# Patient Record
Sex: Male | Born: 1975 | Race: Black or African American | Hispanic: No | Marital: Single | Smoking: Never smoker
Health system: Southern US, Community
[De-identification: ages and names within clinical notes are randomized; demographics above are authoritative.]

## PROBLEM LIST (undated history)

## (undated) DIAGNOSIS — J45909 Unspecified asthma, uncomplicated: Secondary | ICD-10-CM

## (undated) HISTORY — PX: OTHER SURGICAL HISTORY: SHX169

---

## 2004-11-02 ENCOUNTER — Emergency Department: Payer: Self-pay | Admitting: Emergency Medicine

## 2005-02-06 ENCOUNTER — Emergency Department: Payer: Self-pay | Admitting: Emergency Medicine

## 2005-05-27 ENCOUNTER — Emergency Department: Payer: Self-pay | Admitting: Emergency Medicine

## 2005-12-13 ENCOUNTER — Emergency Department: Payer: Self-pay | Admitting: Internal Medicine

## 2007-01-19 ENCOUNTER — Emergency Department: Payer: Self-pay | Admitting: Emergency Medicine

## 2013-05-15 ENCOUNTER — Emergency Department: Payer: Self-pay | Admitting: Internal Medicine

## 2016-04-01 ENCOUNTER — Encounter: Payer: Self-pay | Admitting: *Deleted

## 2016-04-01 ENCOUNTER — Emergency Department: Payer: BLUE CROSS/BLUE SHIELD

## 2016-04-01 DIAGNOSIS — J40 Bronchitis, not specified as acute or chronic: Secondary | ICD-10-CM | POA: Insufficient documentation

## 2016-04-01 DIAGNOSIS — R079 Chest pain, unspecified: Secondary | ICD-10-CM | POA: Diagnosis present

## 2016-04-01 LAB — BASIC METABOLIC PANEL
ANION GAP: 5 (ref 5–15)
BUN: 21 mg/dL — ABNORMAL HIGH (ref 6–20)
CALCIUM: 9.2 mg/dL (ref 8.9–10.3)
CO2: 29 mmol/L (ref 22–32)
Chloride: 107 mmol/L (ref 101–111)
Creatinine, Ser: 1.11 mg/dL (ref 0.61–1.24)
GFR calc Af Amer: >60 mL/min (ref >60)
GFR calc non Af Amer: >60 mL/min (ref >60)
Glucose, Bld: 96 mg/dL (ref 65–99)
POTASSIUM: 3.8 mmol/L (ref 3.5–5.1)
Sodium: 141 mmol/L (ref 135–145)

## 2016-04-01 LAB — CBC
HEMATOCRIT: 41.3 % (ref 40.0–52.0)
HEMOGLOBIN: 13.6 g/dL (ref 13.0–18.0)
MCH: 28 pg (ref 26.0–34.0)
MCHC: 33 g/dL (ref 32.0–36.0)
MCV: 85 fL (ref 80.0–100.0)
Platelets: 317 10*3/uL (ref 150–440)
RBC: 4.86 MIL/uL (ref 4.40–5.90)
RDW: 14.9 % — ABNORMAL HIGH (ref 11.5–14.5)
WBC: 6.6 10*3/uL (ref 3.8–10.6)

## 2016-04-01 LAB — TROPONIN I

## 2016-04-01 MED ORDER — ACETAMINOPHEN 325 MG PO TABS
650.0000 mg | ORAL_TABLET | Freq: Once | ORAL | Status: AC | PRN
  Administered 2016-04-01: 650 mg via ORAL
  Filled 2016-04-01: qty 2

## 2016-04-01 NOTE — ED Notes (Signed)
Pt currently reporting a headache. Pt offered standing order tylenol and requested to have medication while waiting in lobby.

## 2016-04-01 NOTE — ED Triage Notes (Signed)
PT to ED reporting centralized chest pain when he coughs for the past week. Pt reports having been treated for bronchitis over a month ago for similar chest pains. PT reports the chest pain only occurs when he coughs. Pt reports congested cough with yellow sputum. Pt denies fevers at home. No NVD, dizziness or lightheadedness. No SOB.

## 2016-04-02 ENCOUNTER — Emergency Department
Admission: EM | Admit: 2016-04-02 | Discharge: 2016-04-02 | Disposition: A | Discharge status: Home / Self Care | Payer: BLUE CROSS/BLUE SHIELD | Attending: Emergency Medicine | Admitting: Emergency Medicine

## 2016-04-02 DIAGNOSIS — J40 Bronchitis, not specified as acute or chronic: Secondary | ICD-10-CM

## 2016-04-02 MED ORDER — IPRATROPIUM-ALBUTEROL 0.5-2.5 (3) MG/3ML IN SOLN
3.0000 mL | Freq: Once | RESPIRATORY_TRACT | Status: AC
  Administered 2016-04-02: 3 mL via RESPIRATORY_TRACT
  Filled 2016-04-02: qty 3

## 2016-04-02 MED ORDER — ALBUTEROL SULFATE HFA 108 (90 BASE) MCG/ACT IN AERS: 2.0000 | INHALATION_SPRAY | RESPIRATORY_TRACT | 0 refills | Status: DC | PRN

## 2016-04-02 MED ORDER — PREDNISONE 20 MG PO TABS
60.0000 mg | ORAL_TABLET | Freq: Once | ORAL | Status: AC
  Administered 2016-04-02: 60 mg via ORAL
  Filled 2016-04-02: qty 3

## 2016-04-02 MED ORDER — PREDNISONE 20 MG PO TABS: ORAL_TABLET | ORAL | 0 refills | Status: DC

## 2016-04-02 MED ORDER — HYDROCOD POLST-CPM POLST ER 10-8 MG/5ML PO SUER
5.0000 mL | Freq: Once | ORAL | Status: AC
  Administered 2016-04-02: 5 mL via ORAL
  Filled 2016-04-02: qty 5

## 2016-04-02 MED ORDER — HYDROCOD POLST-CPM POLST ER 10-8 MG/5ML PO SUER: 5.0000 mL | Freq: Two times a day (BID) | ORAL | 0 refills | Status: DC

## 2016-04-02 NOTE — Discharge Instructions (Signed)
1. Finish steroid as prescribed (prednisone 60 mg daily 4 days). 2. You may take cough medicine as needed. 3. Use albuterol inhaler 2 puffs every 4 hours as needed for cough/wheezing. 4. Return to the ER for worsening symptoms, persistent vomiting, difficulty breathing or other concerns.

## 2016-04-02 NOTE — ED Provider Notes (Signed)
[facility] Emergency Department Provider Note   ____________________________________________   First MD Initiated Contact with Patient 04/02/16 0315     (approximate)  I have reviewed the triage vital signs and the nursing notes.   HISTORY  Chief Complaint Chest Pain    HPI Robert Vance is a 41 y.o. male who presents to the ED from home with a chief complaint of cough and chest wall pain. Patient reports symptoms for the past week. History of bronchitis 2 months ago with similar symptoms of central chest discomfort only upon coughing. Reports congestion and cough with yellow sputum. Denies associated fever, chills, shortness of breath, abdominal pain, nausea, vomiting, diarrhea.Denies recent travel or trauma. Nothing makes his symptoms better or worse.   Past medical history None  There are no active problems to display for this patient.   History reviewed. No pertinent surgical history.  Prior to Admission medications   Not on File    Allergies Shellfish allergy  History reviewed. No pertinent family history.  Social History Social History  Substance Use Topics  . Smoking status: Never Smoker  . Smokeless tobacco: Never Used  . Alcohol use No    Review of Systems  Constitutional: No fever/chills. Eyes: No visual changes. ENT: No sore throat. Cardiovascular: Positive for chest wall pain. Respiratory: Positive for congested and productive cough. Denies shortness of breath. Gastrointestinal: No abdominal pain.  No nausea, no vomiting.  No diarrhea.  No constipation. Genitourinary: Negative for dysuria. Musculoskeletal: Negative for back pain. Skin: Negative for rash. Neurological: Negative for headaches, focal weakness or numbness.  10-point ROS otherwise negative.  ____________________________________________   PHYSICAL EXAM:  VITAL SIGNS: ED Triage Vitals  Enc Vitals Group     BP 04/02/16 0219 (!) 147/95     Pulse  Rate 04/02/16 0219 91     Resp 04/02/16 0219 20     Temp 04/02/16 0219 98.6 F (37 C)     Temp Source 04/02/16 0219 Oral     SpO2 04/02/16 0219 95 %     Weight 04/01/16 2252 176 lb (79.8 kg)     Height 04/01/16 2252 5\' 7"  (1.702 m)     Head Circumference --      Peak Flow --      Pain Score 04/01/16 2253 5     Pain Loc --      Pain Edu? --      Excl. in GC? --     Constitutional: Alert and oriented. Well appearing and in no acute distress. Eyes: Conjunctivae are normal. PERRL. EOMI. Head: Atraumatic. Nose: No congestion/rhinnorhea. Mouth/Throat: Mucous membranes are moist.  Oropharynx non-erythematous. Neck: No stridor.   Cardiovascular: Normal rate, regular rhythm. Grossly normal heart sounds.  Good peripheral circulation. Respiratory: Normal respiratory effort.  No retractions. Lungs with scattered wheezing. Anterior chest wall tender to palpation.  Gastrointestinal: Soft and nontender. No distention. No abdominal bruits. No CVA tenderness. Musculoskeletal: No lower extremity tenderness nor edema.  No joint effusions. Neurologic:  Normal speech and language. No gross focal neurologic deficits are appreciated. No gait instability. Skin:  Skin is warm, dry and intact. No rash noted. Psychiatric: Mood and affect are normal. Speech and behavior are normal.  ____________________________________________   LABS (all labs ordered are listed, but only abnormal results are displayed)  Labs Reviewed  BASIC METABOLIC PANEL - Abnormal; Notable for the following:       Result Value   BUN 21 (*)    All other components  within normal limits  CBC - Abnormal; Notable for the following:    RDW 14.9 (*)    All other components within normal limits  TROPONIN I   ____________________________________________  EKG  ED ECG REPORT I, SUNG,JADE J, the attending physician, personally viewed and interpreted this ECG.   Date: 04/02/2016  EKG Time: 2256  Rate: 89  Rhythm: normal EKG, normal  sinus rhythm  Axis: Normal  Intervals:none  ST&T Change: Nonspecific  ____________________________________________  RADIOLOGY  Chest X-ray interpreted per Dr. Clovis Riley: No active cardiopulmonary disease. ____________________________________________   PROCEDURES  Procedure(s) performed: None  Procedures  Critical Care performed: No  ____________________________________________   INITIAL IMPRESSION / ASSESSMENT AND PLAN / ED COURSE  Pertinent labs & imaging results that were available during my care of the patient were reviewed by me and considered in my medical decision making (see chart for details).  41 year old male who presents with bronchitis. Will initiate treatment with DuoNeb, prednisone and Tussionex. Will reassess after nebulizer treatment.  Clinical Course as of Apr 03 554  Sat Apr 02, 2016  1610 Wheezing resolved. Patient feeling better. Room air saturations 96%. Strict return precautions given. Patient verbalizes understanding and agrees with plan of care.  [JS]    Clinical Course User Index [JS] Irean Hong, MD     ____________________________________________   FINAL CLINICAL IMPRESSION(S) / ED DIAGNOSES  Final diagnoses:  Bronchitis      NEW MEDICATIONS STARTED DURING THIS VISIT:  New Prescriptions   No medications on file     Note:  This document was prepared using Dragon voice recognition software and may include unintentional dictation errors.    Irean Hong, MD 04/02/16 [phone]

## 2016-05-28 ENCOUNTER — Emergency Department: Payer: BLUE CROSS/BLUE SHIELD

## 2016-05-28 ENCOUNTER — Emergency Department
Admission: EM | Admit: 2016-05-28 | Discharge: 2016-05-28 | Disposition: A | Discharge status: Home / Self Care | Payer: BLUE CROSS/BLUE SHIELD | Attending: Emergency Medicine | Admitting: Emergency Medicine

## 2016-05-28 DIAGNOSIS — J069 Acute upper respiratory infection, unspecified: Secondary | ICD-10-CM | POA: Insufficient documentation

## 2016-05-28 DIAGNOSIS — R0602 Shortness of breath: Secondary | ICD-10-CM | POA: Diagnosis present

## 2016-05-28 DIAGNOSIS — J9801 Acute bronchospasm: Secondary | ICD-10-CM | POA: Diagnosis not present

## 2016-05-28 LAB — BASIC METABOLIC PANEL
ANION GAP: 8 (ref 5–15)
BUN: 18 mg/dL (ref 6–20)
CALCIUM: 9.4 mg/dL (ref 8.9–10.3)
CO2: 28 mmol/L (ref 22–32)
Chloride: 104 mmol/L (ref 101–111)
Creatinine, Ser: 0.94 mg/dL (ref 0.61–1.24)
GFR calc Af Amer: >60 mL/min (ref >60)
Glucose, Bld: 141 mg/dL — ABNORMAL HIGH (ref 65–99)
POTASSIUM: 4.4 mmol/L (ref 3.5–5.1)
Sodium: 140 mmol/L (ref 135–145)

## 2016-05-28 LAB — CBC
HCT: 43.3 % (ref 40.0–52.0)
HEMOGLOBIN: 14.4 g/dL (ref 13.0–18.0)
MCH: 28.1 pg (ref 26.0–34.0)
MCHC: 33.3 g/dL (ref 32.0–36.0)
MCV: 84.5 fL (ref 80.0–100.0)
PLATELETS: 340 10*3/uL (ref 150–440)
RBC: 5.12 MIL/uL (ref 4.40–5.90)
RDW: 14.2 % (ref 11.5–14.5)
WBC: 9.4 10*3/uL (ref 3.8–10.6)

## 2016-05-28 LAB — TROPONIN I

## 2016-05-28 MED ORDER — ALBUTEROL SULFATE (2.5 MG/3ML) 0.083% IN NEBU
5.0000 mg | INHALATION_SOLUTION | Freq: Once | RESPIRATORY_TRACT | Status: AC
  Administered 2016-05-28: 5 mg via RESPIRATORY_TRACT
  Filled 2016-05-28: qty 6

## 2016-05-28 MED ORDER — METHYLPREDNISOLONE SODIUM SUCC 125 MG IJ SOLR
125.0000 mg | Freq: Once | INTRAMUSCULAR | Status: AC
  Administered 2016-05-28: 125 mg via INTRAVENOUS
  Filled 2016-05-28: qty 2

## 2016-05-28 MED ORDER — ALBUTEROL SULFATE HFA 108 (90 BASE) MCG/ACT IN AERS: 2.0000 | INHALATION_SPRAY | Freq: Four times a day (QID) | RESPIRATORY_TRACT | 2 refills | Status: DC | PRN

## 2016-05-28 MED ORDER — IPRATROPIUM-ALBUTEROL 0.5-2.5 (3) MG/3ML IN SOLN
3.0000 mL | Freq: Once | RESPIRATORY_TRACT | Status: AC
  Administered 2016-05-28: 3 mL via RESPIRATORY_TRACT
  Filled 2016-05-28: qty 3

## 2016-05-28 MED ORDER — ALBUTEROL SULFATE (2.5 MG/3ML) 0.083% IN NEBU
INHALATION_SOLUTION | RESPIRATORY_TRACT | Status: AC
  Administered 2016-05-28: 5 mg via RESPIRATORY_TRACT
  Filled 2016-05-28: qty 6

## 2016-05-28 MED ORDER — PREDNISONE 20 MG PO TABS: 40.0000 mg | ORAL_TABLET | Freq: Every day | ORAL | 0 refills | Status: DC

## 2016-05-28 MED ORDER — AZITHROMYCIN 250 MG PO TABS: ORAL_TABLET | ORAL | 0 refills | Status: AC

## 2016-05-28 NOTE — ED Triage Notes (Signed)
Pt presents via POV from home with reports of shortness of breath and wheezing that have been worse since yesterday. Pt denies any hx of asthma or COPD, does not use inhalers at home. States he was treated about 2 months ago for viral bronchitis. States he thinks its worse now. Dry non=productive cough.

## 2016-05-28 NOTE — ED Notes (Signed)
MD Paduchowski at bedside  

## 2016-05-28 NOTE — ED Provider Notes (Signed)
[facility] Emergency Department Provider Note  Time seen: 8:10 AM  I have reviewed the triage vital signs and the nursing notes.   HISTORY  Chief Complaint Shortness of Breath and Wheezing    HPI Robert Vance is a 41 y.o. male with no past medical history who presents to the emergency department for difficulty breathing. According to the patient for the past 2 or 3 days he has had a dry cough with a mild wheeze. Patient states he was having car trouble and he got a ride to work today with someone who smokes, he states since that ride he has been feeling significantly short of breath with significant wheeze at work. Patient came to the emergency department due to the difficulty breathing. Upon arrival he has a 90% room air saturation. Patient states this has happened twice in the past both times he was diagnosed with bronchitis and discharged with inhalers which seemed to help, he states the last time this occurred was approximately 2 or 3 months ago. He does not have any underlying asthma that he is aware of. Denies any smoking history. No history of COPD. Currently describes his shortness of breath is moderate. Denies any chest pain. He does state an occasional dry cough without sputum production. Denies any vomiting or diarrhea. Denies any congestion.  No past medical history on file.  There are no active problems to display for this patient.   No past surgical history on file.  Prior to Admission medications   Medication Sig Start Date End Date Taking? Authorizing Provider  albuterol (PROVENTIL HFA;VENTOLIN HFA) 108 (90 Base) MCG/ACT inhaler Inhale 2 puffs into the lungs every 4 (four) hours as needed for wheezing or shortness of breath. 04/02/16   Irean Hong, MD  chlorpheniramine-HYDROcodone (TUSSIONEX PENNKINETIC ER) 10-8 MG/5ML SUER Take 5 mLs by mouth 2 (two) times daily. 04/02/16   Irean Hong, MD  predniSONE (DELTASONE) 20 MG tablet 3 tablets daily x 4  days 04/02/16   Irean Hong, MD    Allergies  Allergen Reactions  . Shellfish Allergy     No family history on file.  Social History Social History  Substance Use Topics  . Smoking status: Never Smoker  . Smokeless tobacco: Never Used  . Alcohol use No    Review of Systems Constitutional: Negative for fever. Eyes: Negative for visual changes. ENT: Negative for congestion Cardiovascular: Negative for chest pain. Respiratory: Moderate shortness of breath, dry cough Gastrointestinal: Negative for abdominal pain, vomiting and diarrhea. Genitourinary: Negative for dysuria. Musculoskeletal: Negative for back pain. Skin: Negative for rash. Neurological: Negative for headache All other ROS negative  ____________________________________________   PHYSICAL EXAM:  VITAL SIGNS: ED Triage Vitals  Enc Vitals Group     BP 05/28/16 0751 (!) 156/73     Pulse Rate 05/28/16 0751 (!) 107     Resp 05/28/16 0751 (!) 30     Temp 05/28/16 0810 97.7 F (36.5 C)     Temp Source 05/28/16 0810 Oral     SpO2 05/28/16 0751 90 %     Weight --      Height --      Head Circumference --      Peak Flow --      Pain Score --      Pain Loc --      Pain Edu? --      Excl. in GC? --     Constitutional: Alert and oriented. Well appearing and  in no distress. Eyes: Normal exam ENT   Head: Normocephalic and atraumatic.   Nose: No congestion/rhinnorhea.   Mouth/Throat: Mucous membranes are moist. Cardiovascular: Normal rate, regular rhythm. No murmur Respiratory: Moderate tachypnea with mild to moderate expiratory wheezes bilaterally. No rales or rhonchi. Gastrointestinal: Soft and nontender. No distention. Musculoskeletal: Nontender with normal range of motion in all extremities. No lower extremity tenderness or edema. Neurologic:  Normal speech and language. No gross focal neurologic deficits  Skin:  Skin is warm, dry and intact.  Psychiatric: Mood and affect are normal.    ____________________________________________    EKG  EKG reviewed and interpreted by myself shows sinus tachycardia at 107 bpm, narrow QRS, normal axis, normal intervals, no concerning ST changes.  ____________________________________________    RADIOLOGY  Chest x-ray negative  ____________________________________________   INITIAL IMPRESSION / ASSESSMENT AND PLAN / ED COURSE  Pertinent labs & imaging results that were available during my care of the patient were reviewed by me and considered in my medical decision making (see chart for details).  Patient presents the emergency department with wheeze, shortness of breath and a dry cough. Patient states the symptoms are recurrent, highly suspect an underlying degree of reactive airway disease with either environmental triggers or infectious triggers. Patient has moderate expiratory wheeze currently. We'll treat with DuoNeb's in the emergency department. We will treat with Solu-Medrol, obtain basic labs as well as a chest x-ray and continue to closely monitor.  Patient's labs are largely within normal limits including normal white blood cell count, negative troponin. Patient's chest x-ray is negative. Patient states he is feeling much better currently satting 94% on room air. We will discharge with albuterol, 5 days of prednisone. I discussed strict return precautions with the patient. We will also cover with a Z-Pak as a precaution. Patient agreeable to plan.  ____________________________________________   FINAL CLINICAL IMPRESSION(S) / ED DIAGNOSES  Dyspnea Reactive airway disease    Minna AntisPaduchowski, Salima Rumer, MD 05/28/16 [phone]

## 2016-05-28 NOTE — ED Notes (Signed)
Pt verbalized understanding of discharge instructions. NAD at this time. 

## 2016-05-28 NOTE — ED Notes (Signed)
Patient transported to X-ray 

## 2016-07-24 ENCOUNTER — Emergency Department
Admission: EM | Admit: 2016-07-24 | Discharge: 2016-07-24 | Disposition: A | Discharge status: Home / Self Care | Payer: BLUE CROSS/BLUE SHIELD | Attending: Emergency Medicine | Admitting: Emergency Medicine

## 2016-07-24 ENCOUNTER — Emergency Department: Payer: BLUE CROSS/BLUE SHIELD

## 2016-07-24 DIAGNOSIS — R0602 Shortness of breath: Secondary | ICD-10-CM | POA: Diagnosis not present

## 2016-07-24 DIAGNOSIS — Z5321 Procedure and treatment not carried out due to patient leaving prior to being seen by health care provider: Secondary | ICD-10-CM | POA: Diagnosis not present

## 2016-07-24 MED ORDER — ALBUTEROL SULFATE (2.5 MG/3ML) 0.083% IN NEBU
5.0000 mg | INHALATION_SOLUTION | Freq: Once | RESPIRATORY_TRACT | Status: AC
  Administered 2016-07-24: 5 mg via RESPIRATORY_TRACT
  Filled 2016-07-24: qty 6

## 2016-07-24 NOTE — ED Notes (Signed)
Pt refusing xray until seen by MD

## 2016-07-24 NOTE — ED Triage Notes (Addendum)
Patient reports being short of breath since Tuesday/Wednesday, got "bad" 2 hours prior to arrival.  Patient with audible wheezing heard in triage.

## 2016-07-24 NOTE — ED Notes (Signed)
Called in waiting room x 2.  No answer.

## 2016-07-25 ENCOUNTER — Encounter: Payer: Self-pay | Admitting: Emergency Medicine

## 2016-07-25 ENCOUNTER — Emergency Department
Admission: EM | Admit: 2016-07-25 | Discharge: 2016-07-25 | Disposition: A | Discharge status: Home / Self Care | Payer: BLUE CROSS/BLUE SHIELD | Attending: Emergency Medicine | Admitting: Emergency Medicine

## 2016-07-25 DIAGNOSIS — J209 Acute bronchitis, unspecified: Secondary | ICD-10-CM | POA: Diagnosis not present

## 2016-07-25 DIAGNOSIS — R05 Cough: Secondary | ICD-10-CM | POA: Diagnosis not present

## 2016-07-25 DIAGNOSIS — R0602 Shortness of breath: Secondary | ICD-10-CM | POA: Diagnosis present

## 2016-07-25 MED ORDER — IPRATROPIUM-ALBUTEROL 0.5-2.5 (3) MG/3ML IN SOLN
3.0000 mL | Freq: Once | RESPIRATORY_TRACT | Status: AC
  Administered 2016-07-25: 3 mL via RESPIRATORY_TRACT
  Filled 2016-07-25: qty 3

## 2016-07-25 MED ORDER — ALBUTEROL SULFATE HFA 108 (90 BASE) MCG/ACT IN AERS: 2.0000 | INHALATION_SPRAY | Freq: Four times a day (QID) | RESPIRATORY_TRACT | 0 refills | Status: DC | PRN

## 2016-07-25 MED ORDER — METHYLPREDNISOLONE SODIUM SUCC 125 MG IJ SOLR
125.0000 mg | Freq: Once | INTRAMUSCULAR | Status: AC
  Administered 2016-07-25: 125 mg via INTRAMUSCULAR
  Filled 2016-07-25: qty 2

## 2016-07-25 MED ORDER — PREDNISONE 10 MG (21) PO TBPK: ORAL_TABLET | ORAL | 0 refills | Status: DC

## 2016-07-25 NOTE — ED Triage Notes (Signed)
Says cough cold for 3 days.  Says he had an inhaler, but it is empty now.

## 2016-07-25 NOTE — ED Provider Notes (Signed)
[facility] Emergency Department Provider Note  ____________________________________________  Time seen: Approximately 1:23 PM  I have reviewed the triage vital signs and the nursing notes.   HISTORY  Chief Complaint Cough and Shortness of Breath    HPI Robert Vance is a 41 y.o. male that presents emergency department with shortness of breath, wheezing, and non productive cough for 3 days.Patient states that he is been diagnosed with bronchitis many times in the past. He has had several chest x-rays and none of them ever show anything. He usually takes albuterol inhaler but no longer has one. He does not have asthma or allergies. He denies fever, nasal congestion, chest pain, nausea, vomiting, abdominal pain.   History reviewed. No pertinent past medical history.  There are no active problems to display for this patient.   History reviewed. No pertinent surgical history.  Prior to Admission medications   Medication Sig Start Date End Date Taking? Authorizing Provider  albuterol (PROVENTIL HFA;VENTOLIN HFA) 108 (90 Base) MCG/ACT inhaler Inhale 2 puffs into the lungs every 6 (six) hours as needed for wheezing or shortness of breath. 07/25/16   Enid Derry, PA-C  chlorpheniramine-HYDROcodone (TUSSIONEX PENNKINETIC ER) 10-8 MG/5ML SUER Take 5 mLs by mouth 2 (two) times daily. Patient not taking: Reported on 05/28/2016 04/02/16   Irean Hong, MD  predniSONE (STERAPRED UNI-PAK 21 TAB) 10 MG (21) TBPK tablet Take 6 tablets on day 1, take 5 tablets on day 2, take 4 tablets on day 3, take 3 tablets on day 4, take 2 tablets on day 5, take 1 tablet on day 6 07/25/16   Enid Derry, PA-C    Allergies Shellfish allergy  No family history on file.  Social History Social History  Substance Use Topics  . Smoking status: Never Smoker  . Smokeless tobacco: Never Used  . Alcohol use No     Review of Systems  Constitutional: No fever/chills ENT: Negative for  congestion and rhinorrhea. Cardiovascular: No chest pain. Respiratory: Positive for cough.  Gastrointestinal: No abdominal pain.  No nausea, no vomiting.  No diarrhea.  No constipation. Musculoskeletal: Negative for musculoskeletal pain. Skin: Negative for rash, abrasions, lacerations, ecchymosis. Neurological: Negative for headaches.   ____________________________________________   PHYSICAL EXAM:  VITAL SIGNS: ED Triage Vitals  Enc Vitals Group     BP 07/25/16 1201 138/89     Pulse Rate 07/25/16 1201 85     Resp 07/25/16 1201 16     Temp 07/25/16 1201 98 F (36.7 C)     Temp Source 07/25/16 1201 Oral     SpO2 07/25/16 1201 93 %     Weight 07/25/16 1202 176 lb (79.8 kg)     Height 07/25/16 1202 5\' 7"  (1.702 m)     Head Circumference --      Peak Flow --      Pain Score --      Pain Loc --      Pain Edu? --      Excl. in GC? --      Constitutional: Alert and oriented. Well appearing and in no acute distress. Eyes: Conjunctivae are normal. PERRL. EOMI. No discharge. Head: Atraumatic. ENT:       Ears:      Nose: No congestion/rhinnorhea.      Mouth/Throat: Mucous membranes are moist. Oropharynx non-erythematous.  Neck: No stridor.   Hematological/Lymphatic/Immunilogical: No cervical lymphadenopathy. Cardiovascular: Normal rate, regular rhythm.  Good peripheral circulation. Respiratory: Normal respiratory effort without tachypnea or retractions. Scattered  wheezes. Good air entry to the bases with no decreased or absent breath sounds. Gastrointestinal: Bowel sounds 4 quadrants. Soft and nontender to palpation. No guarding or rigidity. No palpable masses. No distention. Musculoskeletal: Full range of motion to all extremities. No gross deformities appreciated. Neurologic:  Normal speech and language. No gross focal neurologic deficits are appreciated.  Skin:  Skin is warm, dry and intact. No rash noted.   ____________________________________________   LABS (all labs  ordered are listed, but only abnormal results are displayed)  Labs Reviewed - No data to display ____________________________________________  EKG   ____________________________________________  RADIOLOGY  No results found.  ____________________________________________    PROCEDURES  Procedure(s) performed:    Procedures    Medications  ipratropium-albuterol (DUONEB) 0.5-2.5 (3) MG/3ML nebulizer solution 3 mL (3 mLs Nebulization Given 07/25/16 1328)  ipratropium-albuterol (DUONEB) 0.5-2.5 (3) MG/3ML nebulizer solution 3 mL (3 mLs Nebulization Given 07/25/16 1431)  methylPREDNISolone sodium succinate (SOLU-MEDROL) 125 mg/2 mL injection 125 mg (125 mg Intramuscular Given 07/25/16 1516)     ____________________________________________   INITIAL IMPRESSION / ASSESSMENT AND PLAN / ED COURSE  Pertinent labs & imaging results that were available during my care of the patient were reviewed by me and considered in my medical decision making (see chart for details).  Review of the [place] CSRS was performed in accordance of the [facility] prior to dispensing any controlled drugs.   Patient's diagnosis is consistent with bronchitis. Vital signs and exam are reassuring. We discussed doing a chest x-ray and patient does not want a chest x-ray at this time. He felt better and wheezing improved after 2 DuoNeb treatments. Patient appears well. Patient feels comfortable going home. Patient will be discharged home with prescriptions for prednisone and albuterol inhaler. Patient is to follow up with PCP as needed or otherwise directed. Patient is given ED precautions to return to the ED for any worsening or new symptoms.     ____________________________________________  FINAL CLINICAL IMPRESSION(S) / ED DIAGNOSES  Final diagnoses:  Acute bronchitis, unspecified organism      NEW MEDICATIONS STARTED DURING THIS VISIT:  Discharge Medication List as of 07/25/2016  3:03 PM    START taking these  medications   Details  predniSONE (STERAPRED UNI-PAK 21 TAB) 10 MG (21) TBPK tablet Take 6 tablets on day 1, take 5 tablets on day 2, take 4 tablets on day 3, take 3 tablets on day 4, take 2 tablets on day 5, take 1 tablet on day 6, Print            This chart was dictated using voice recognition software/Dragon. Despite best efforts to proofread, errors can occur which can change the meaning. Any change was purely unintentional.    Enid DerryWagner, Myranda Pavone, PA-C 07/25/16 1533    Merrily Brittleifenbark, Neil, MD 07/27/16 [phone]

## 2016-07-25 NOTE — ED Notes (Signed)
See triage note  Developed a cough about 3 days ago  States cough has been intermittently prod, yellowish phlegm.. No fever  States he also noticed some wheezing

## 2016-09-14 ENCOUNTER — Emergency Department: Payer: BLUE CROSS/BLUE SHIELD

## 2016-09-14 ENCOUNTER — Encounter: Payer: Self-pay | Admitting: Emergency Medicine

## 2016-09-14 ENCOUNTER — Observation Stay
Admission: EM | Admit: 2016-09-14 | Discharge: 2016-09-14 | Discharge status: Against Medical Advice | Payer: BLUE CROSS/BLUE SHIELD | Attending: Internal Medicine | Admitting: Internal Medicine

## 2016-09-14 DIAGNOSIS — R05 Cough: Secondary | ICD-10-CM | POA: Diagnosis present

## 2016-09-14 DIAGNOSIS — J40 Bronchitis, not specified as acute or chronic: Principal | ICD-10-CM | POA: Insufficient documentation

## 2016-09-14 DIAGNOSIS — R0902 Hypoxemia: Secondary | ICD-10-CM | POA: Diagnosis present

## 2016-09-14 DIAGNOSIS — Z5321 Procedure and treatment not carried out due to patient leaving prior to being seen by health care provider: Secondary | ICD-10-CM | POA: Diagnosis not present

## 2016-09-14 LAB — COMPREHENSIVE METABOLIC PANEL
ALT: 17 U/L (ref 17–63)
ANION GAP: 7 (ref 5–15)
AST: 22 U/L (ref 15–41)
Albumin: 4 g/dL (ref 3.5–5.0)
Alkaline Phosphatase: 81 U/L (ref 38–126)
BUN: 21 mg/dL — ABNORMAL HIGH (ref 6–20)
CHLORIDE: 106 mmol/L (ref 101–111)
CO2: 27 mmol/L (ref 22–32)
Calcium: 9.3 mg/dL (ref 8.9–10.3)
Creatinine, Ser: 1.03 mg/dL (ref 0.61–1.24)
Glucose, Bld: 113 mg/dL — ABNORMAL HIGH (ref 65–99)
POTASSIUM: 4 mmol/L (ref 3.5–5.1)
Sodium: 140 mmol/L (ref 135–145)
Total Bilirubin: 0.7 mg/dL (ref 0.3–1.2)
Total Protein: 7.4 g/dL (ref 6.5–8.1)

## 2016-09-14 LAB — CBC WITH DIFFERENTIAL/PLATELET
BASOS ABS: 0 10*3/uL (ref 0–0.1)
Basophils Relative: 1 %
EOS PCT: 23 %
Eosinophils Absolute: 1.8 10*3/uL — ABNORMAL HIGH (ref 0–0.7)
HCT: 41.2 % (ref 40.0–52.0)
Hemoglobin: 13.8 g/dL (ref 13.0–18.0)
LYMPHS PCT: 15 %
Lymphs Abs: 1.1 10*3/uL (ref 1.0–3.6)
MCH: 28.3 pg (ref 26.0–34.0)
MCHC: 33.6 g/dL (ref 32.0–36.0)
MCV: 84.4 fL (ref 80.0–100.0)
MONO ABS: 0.7 10*3/uL (ref 0.2–1.0)
Monocytes Relative: 9 %
Neutro Abs: 4.1 10*3/uL (ref 1.4–6.5)
Neutrophils Relative %: 52 %
PLATELETS: 309 10*3/uL (ref 150–440)
RBC: 4.88 MIL/uL (ref 4.40–5.90)
RDW: 14.3 % (ref 11.5–14.5)
WBC: 7.7 10*3/uL (ref 3.8–10.6)

## 2016-09-14 LAB — BLOOD GAS, VENOUS
ACID-BASE EXCESS: 3.9 mmol/L — AB (ref 0.0–2.0)
BICARBONATE: 31.3 mmol/L — AB (ref 20.0–28.0)
O2 Saturation: 63 %
PH VEN: 7.34 (ref 7.250–7.430)
PO2 VEN: 35 mmHg (ref 32.0–45.0)
Patient temperature: 37
pCO2, Ven: 58 mmHg (ref 44.0–60.0)

## 2016-09-14 MED ORDER — IPRATROPIUM-ALBUTEROL 0.5-2.5 (3) MG/3ML IN SOLN
3.0000 mL | Freq: Once | RESPIRATORY_TRACT | Status: AC
  Administered 2016-09-14: 3 mL via RESPIRATORY_TRACT

## 2016-09-14 MED ORDER — IPRATROPIUM-ALBUTEROL 0.5-2.5 (3) MG/3ML IN SOLN
3.0000 mL | Freq: Once | RESPIRATORY_TRACT | Status: AC
Start: 2016-09-14 — End: 2016-09-14
  Administered 2016-09-14: 3 mL via RESPIRATORY_TRACT
  Filled 2016-09-14: qty 9

## 2016-09-14 MED ORDER — DEXTROSE 5 % IV SOLN: 500.0000 mg | INTRAVENOUS | Status: DC

## 2016-09-14 MED ORDER — ALBUTEROL SULFATE HFA 108 (90 BASE) MCG/ACT IN AERS: 2.0000 | INHALATION_SPRAY | Freq: Four times a day (QID) | RESPIRATORY_TRACT | 0 refills | Status: DC | PRN

## 2016-09-14 MED ORDER — MAGNESIUM SULFATE 2 GM/50ML IV SOLN
2.0000 g | Freq: Once | INTRAVENOUS | Status: AC
  Administered 2016-09-14: 2 g via INTRAVENOUS

## 2016-09-14 MED ORDER — METHYLPREDNISOLONE SODIUM SUCC 125 MG IJ SOLR
125.0000 mg | Freq: Once | INTRAMUSCULAR | Status: AC
  Administered 2016-09-14: 125 mg via INTRAVENOUS
  Filled 2016-09-14: qty 2

## 2016-09-14 MED ORDER — SODIUM CHLORIDE 0.9 % IV BOLUS (SEPSIS)
1000.0000 mL | Freq: Once | INTRAVENOUS | Status: AC
  Administered 2016-09-14: 1000 mL via INTRAVENOUS

## 2016-09-14 MED ORDER — MAGNESIUM SULFATE 2 GM/50ML IV SOLN
2.0000 g | Freq: Once | INTRAVENOUS | Status: DC
  Filled 2016-09-14: qty 50

## 2016-09-14 MED ORDER — AZITHROMYCIN 250 MG PO TABS: ORAL_TABLET | ORAL | 0 refills | Status: AC

## 2016-09-14 MED ORDER — PREDNISONE 20 MG PO TABS: 60.0000 mg | ORAL_TABLET | Freq: Every day | ORAL | 0 refills | Status: DC

## 2016-09-14 MED ORDER — DEXTROSE 5 % IV SOLN
500.0000 mg | Freq: Once | INTRAVENOUS | Status: AC
  Administered 2016-09-14: 500 mg via INTRAVENOUS
  Filled 2016-09-14: qty 500

## 2016-09-14 NOTE — ED Notes (Signed)
This RN to bedside at this time with Dr. Tobi Bastos. Pt turned down to 2L via [place] per Dr. Mosetta Putt order. Pt noted to maintain 92-96% on 2L via [place]. Pt states that he does not want to stay. This RN and Dr. Tobi Bastos discussed with patient the need to stay. Dr. Tobi Bastos explained to patient will speak with Dr. Zenda Alpers about pt refusal to stay.

## 2016-09-14 NOTE — ED Provider Notes (Addendum)
[facility] Emergency Department Provider Note   ____________________________________________   First MD Initiated Contact with Patient 09/14/16 [phone]     (approximate)  I have reviewed the triage vital signs and the nursing notes.   HISTORY  Chief Complaint Cough and Shortness of Breath    HPI Robert Vance is a 41 y.o. male Who comes into the hospital today with shortness of breath and coughing. He reports that he was treated a month ago for bronchitis. It cleared up but then came back a week later. He has had this cough for 1-2 weeks. She denies any fevers or chest pain. According to the nurse his oxygen saturation was 88% when he arrived to the emergency department. The patient denies a history of asthma and reports that he has not has never been a smoker. His cough is productive of white and yellow sputum. The patient reports that he thinks this is due to an exposure to something at his jaw or at home. The patient had albuterol after his last visit but he reports that he ran out. He also states that he's had some left-sided ankle pain after going to the gym. The patient is here today for evaluation and treatment.   History reviewed. No pertinent past medical history.  There are no active problems to display for this patient.   History reviewed. No pertinent surgical history.  Prior to Admission medications   Medication Sig Start Date End Date Taking? Authorizing Provider  albuterol (PROVENTIL HFA;VENTOLIN HFA) 108 (90 Base) MCG/ACT inhaler Inhale 2 puffs into the lungs every 6 (six) hours as needed for wheezing or shortness of breath. 07/25/16   Enid Derry, PA-C  chlorpheniramine-HYDROcodone (TUSSIONEX PENNKINETIC ER) 10-8 MG/5ML SUER Take 5 mLs by mouth 2 (two) times daily. Patient not taking: Reported on 05/28/2016 04/02/16   Irean Hong, MD  predniSONE (STERAPRED UNI-PAK 21 TAB) 10 MG (21) TBPK tablet Take 6 tablets on day 1, take 5 tablets on day  2, take 4 tablets on day 3, take 3 tablets on day 4, take 2 tablets on day 5, take 1 tablet on day 6 07/25/16   Enid Derry, PA-C    Allergies Shellfish allergy  No family history on file.  Social History Social History  Substance Use Topics  . Smoking status: Never Smoker  . Smokeless tobacco: Never Used  . Alcohol use No    Review of Systems  Constitutional: No fever/chills Eyes: No visual changes. ENT: No sore throat. Cardiovascular: Denies chest pain. Respiratory: cough and shortness of breath. Gastrointestinal: No abdominal pain.  No nausea, no vomiting.  No diarrhea.  No constipation. Genitourinary: Negative for dysuria. Musculoskeletal: Negative for back pain. Skin: Negative for rash. Neurological: Negative for headaches, focal weakness or numbness.   ____________________________________________   PHYSICAL EXAM:  VITAL SIGNS: ED Triage Vitals  Enc Vitals Group     BP 09/14/16 0413 136/86     Pulse Rate 09/14/16 0413 (!) 105     Resp 09/14/16 0413 18     Temp 09/14/16 0413 97.8 F (36.6 C)     Temp Source 09/14/16 0413 Oral     SpO2 09/14/16 0413 91 %     Weight 09/14/16 0414 176 lb (79.8 kg)     Height 09/14/16 0414 5\' 6"  (1.676 m)     Head Circumference --      Peak Flow --      Pain Score --      Pain Loc --  Pain Edu? --      Excl. in GC? --     Constitutional: Alert and oriented. Well appearing and in moderate distress. Eyes: Conjunctivae are normal. PERRL. EOMI. Head: Atraumatic. Nose: No congestion/rhinnorhea. Mouth/Throat: Mucous membranes are moist.  Oropharynx non-erythematous. Cardiovascular: Normal rate, regular rhythm. Grossly normal heart sounds.  Good peripheral circulation. Respiratory: increased respiratory effort.  No retractions. Wheezes in all lung fields. Gastrointestinal: Soft and nontender. No distention. Positive bowel sounds Musculoskeletal: No lower extremity tenderness nor edema.  . Neurologic:  Normal speech and  language. . Skin:  Skin is warm, dry and intact.  Psychiatric: Mood and affect are normal.   ____________________________________________   LABS (all labs ordered are listed, but only abnormal results are displayed)  Labs Reviewed  CBC WITH DIFFERENTIAL/PLATELET - Abnormal; Notable for the following:       Result Value   Eosinophils Absolute 1.8 (*)    All other components within normal limits  COMPREHENSIVE METABOLIC PANEL - Abnormal; Notable for the following:    Glucose, Bld 113 (*)    BUN 21 (*)    All other components within normal limits  BLOOD GAS, VENOUS   ____________________________________________  EKG  none ____________________________________________  RADIOLOGY  Dg Chest 2 View  Result Date: 09/14/2016 CLINICAL DATA:  Cough. Shortness of breath. Decreased oxygen saturation. EXAM: CHEST  2 VIEW COMPARISON:  Chest radiographs 05/28/2016 FINDINGS: The cardiomediastinal contours are normal. Bronchial thickening which appears chronic. Pulmonary vasculature is normal. No consolidation, pleural effusion, or pneumothorax. No acute osseous abnormalities are seen. IMPRESSION: Chronic bronchial thickening can be seen with bronchitis or asthma. Electronically Signed   By: Rubye Oaks M.D.   On: 09/14/2016 04:56    ____________________________________________   PROCEDURES  Procedure(s) performed: None  Procedures  Critical Care performed: No  ____________________________________________   INITIAL IMPRESSION / ASSESSMENT AND PLAN / ED COURSE  Pertinent labs & imaging results that were available during my care of the patient were reviewed by me and considered in my medical decision making (see chart for details).  This is a 41 year old male who comes into the hospital today with a cough and shortness of breath. I initially gave the patient 3 duo nebs, Solu-Medrol and magnesium sulfate. The patient was placed on 2 L of oxygen by nasal cannula when he initially  arrived for hypoxia. After the medication the patient wheezing is proved but his hypoxia worsened. I am concerned that the patient has some continued bronchitis. He will receive a dose of azithromycin and I will admit him to the hospitalist service.     I received a phone call from the admitting physician stating that the patient wanted to leave AGAINST MEDICAL ADVICE. I went and spoke to the patient about the necessity of him staying in the hospital.He reports that he is a single father to 4 children and he needs to go home to arrange things for them. I asked if there was anyone he could call or if there is any other way to arrange care for the children while in the hospital and he states no. I explained to the patient that with his hypoxia he could become confused, he could stop breathing and his sleep or have severe morbidity and mortality. The patient states that he understands that he has to go home. He reports that he'll either return or go to another hospital once he's arranged care for his children. After a significant amount of time the patient decided he still wanted to leave  AGAINST MEDICAL ADVICE. The patient was wearing supplemental oxygen at the time of this conversation and his saturation was 95%. ____________________________________________   FINAL CLINICAL IMPRESSION(S) / ED DIAGNOSES  Final diagnoses:  Bronchitis  Hypoxia      NEW MEDICATIONS STARTED DURING THIS VISIT:  New Prescriptions   No medications on file     Note:  This document was prepared using Dragon voice recognition software and may include unintentional dictation errors.    Rebecka Apley, MD 09/14/16 0604    Rebecka Apley, MD 09/14/16 8119    Rebecka Apley, MD 09/14/16 [phone]

## 2016-09-14 NOTE — ED Triage Notes (Signed)
Patient with complaint of cough and shortness of breath that started about 2 weeks ago. Patient states that he was seen here about a month ago for the same and diagnosed with bronchitis.

## 2016-09-14 NOTE — ED Notes (Signed)
Dr. Zenda Alpers and this RN to bedside to bedside. Dr. Zenda Alpers to bedside at this time to discuss risks of leaving AMA. Pt is noted to be 93% on 2L via [place]. Pt states that he has been seen "4 times for the same thing and no one said anything or did any of this". Pt states that he doesn't want to stay because he doesn't have someone to take care of his kids. Pt also c/o being "very sleepy because [he] hasn't slept in 3 days".

## 2016-09-14 NOTE — ED Notes (Signed)
This RN reviewed AMA instructions with patient and prescriptions written by MD, this RN reviewed with patient that his O2 saturations were 86% on RA and that Dr. Zenda Alpers and Dr. Tobi Bastos had both been to bedside and discussed risks of leaving AMA with patient. Pt states understanding. Pt states that he is going to leave and come back or go to a different facility. Pt states he wants to know why the last 3 times he was seen they let him leave after his oxygen levels came up, this RN explained that she could not speak to past treatment, only the current care he was receiving and that per the recommendation of both medical doctors patient should stay due to his low oxygen saturation. Pt states understanding, states he is not going to stay that he will be back. This RN re-emphasized that per Dr. Tobi Bastos and Dr. Zenda Alpers leaving could ultimately result in patient death or permanent damage, pt states understanding, states that he wants to leave and go take care of his kids and that he will return her or to another institution.

## 2016-09-14 NOTE — ED Notes (Signed)
Pt states that he is going to leave AMA, is agreeable to staying until abx are finished.

## 2016-09-14 NOTE — Discharge Instructions (Signed)
We had a long conversation about Robert Vance staying in the hospital. Do report that she understands the risks that she take by signing out AGAINST MEDICAL ADVICE. I feel it is important for you to stay in the hospital but you have decided that he did not want to stay.Again there is a risk that he may have a worsened condition or die before returning to the hospital. Please follow up.

## 2016-09-14 NOTE — ED Notes (Signed)
MD to bedside at this time. Upon this RN arrival to room pt is noted to be 88-89% on RA, pt placed on 2L via [place], pt's O2 sats noted to be 93-94%.

## 2016-09-14 NOTE — ED Notes (Signed)
This RN to bedside at this due to low O2 saturation alarm, pt noted to 89% on 2L, pt placed on 3L, maintains between 89-90% on 3L, pt placed on 4L via [place], pt between 90-93% on 4L via [place]. Respirations are noted to be even and unlabored, pt denies any complaints, states he feels sleepy, MD notified.

## 2016-10-31 ENCOUNTER — Emergency Department: Payer: BLUE CROSS/BLUE SHIELD

## 2016-10-31 ENCOUNTER — Encounter: Payer: Self-pay | Admitting: Emergency Medicine

## 2016-10-31 ENCOUNTER — Emergency Department
Admission: EM | Admit: 2016-10-31 | Discharge: 2016-10-31 | Disposition: A | Discharge status: Home / Self Care | Payer: BLUE CROSS/BLUE SHIELD | Attending: Emergency Medicine | Admitting: Emergency Medicine

## 2016-10-31 DIAGNOSIS — R062 Wheezing: Secondary | ICD-10-CM | POA: Diagnosis present

## 2016-10-31 DIAGNOSIS — J4522 Mild intermittent asthma with status asthmaticus: Secondary | ICD-10-CM | POA: Diagnosis not present

## 2016-10-31 DIAGNOSIS — J40 Bronchitis, not specified as acute or chronic: Secondary | ICD-10-CM | POA: Insufficient documentation

## 2016-10-31 DIAGNOSIS — Z79899 Other long term (current) drug therapy: Secondary | ICD-10-CM | POA: Insufficient documentation

## 2016-10-31 HISTORY — DX: Unspecified asthma, uncomplicated: J45.909

## 2016-10-31 LAB — COMPREHENSIVE METABOLIC PANEL
ALT: 17 U/L (ref 17–63)
AST: 24 U/L (ref 15–41)
Albumin: 4.4 g/dL (ref 3.5–5.0)
Alkaline Phosphatase: 78 U/L (ref 38–126)
Anion gap: 7 (ref 5–15)
BUN: 16 mg/dL (ref 6–20)
CHLORIDE: 108 mmol/L (ref 101–111)
CO2: 27 mmol/L (ref 22–32)
CREATININE: 1 mg/dL (ref 0.61–1.24)
Calcium: 9.5 mg/dL (ref 8.9–10.3)
GFR calc non Af Amer: >60 mL/min (ref >60)
Glucose, Bld: 101 mg/dL — ABNORMAL HIGH (ref 65–99)
POTASSIUM: 3.9 mmol/L (ref 3.5–5.1)
SODIUM: 142 mmol/L (ref 135–145)
Total Bilirubin: 1 mg/dL (ref 0.3–1.2)
Total Protein: 7.7 g/dL (ref 6.5–8.1)

## 2016-10-31 LAB — CBC WITH DIFFERENTIAL/PLATELET
Basophils Absolute: 0 10*3/uL (ref 0–0.1)
Basophils Relative: 1 %
EOS ABS: 1.6 10*3/uL — AB (ref 0–0.7)
EOS PCT: 20 %
HCT: 41.1 % (ref 40.0–52.0)
Hemoglobin: 13.9 g/dL (ref 13.0–18.0)
LYMPHS ABS: 1.6 10*3/uL (ref 1.0–3.6)
LYMPHS PCT: 19 %
MCH: 28.9 pg (ref 26.0–34.0)
MCHC: 33.8 g/dL (ref 32.0–36.0)
MCV: 85.4 fL (ref 80.0–100.0)
MONO ABS: 0.7 10*3/uL (ref 0.2–1.0)
MONOS PCT: 9 %
Neutro Abs: 4.1 10*3/uL (ref 1.4–6.5)
Neutrophils Relative %: 51 %
PLATELETS: 301 10*3/uL (ref 150–440)
RBC: 4.81 MIL/uL (ref 4.40–5.90)
RDW: 14.1 % (ref 11.5–14.5)
WBC: 8.1 10*3/uL (ref 3.8–10.6)

## 2016-10-31 MED ORDER — IPRATROPIUM-ALBUTEROL 0.5-2.5 (3) MG/3ML IN SOLN
3.0000 mL | Freq: Once | RESPIRATORY_TRACT | Status: AC
  Administered 2016-10-31: 3 mL via RESPIRATORY_TRACT

## 2016-10-31 MED ORDER — AZITHROMYCIN 500 MG PO TABS
500.0000 mg | ORAL_TABLET | Freq: Once | ORAL | Status: AC
  Administered 2016-10-31: 500 mg via ORAL
  Filled 2016-10-31: qty 1

## 2016-10-31 MED ORDER — METHYLPREDNISOLONE SODIUM SUCC 125 MG IJ SOLR
INTRAMUSCULAR | Status: AC
  Administered 2016-10-31: 125 mg via INTRAVENOUS
  Filled 2016-10-31: qty 2

## 2016-10-31 MED ORDER — IPRATROPIUM-ALBUTEROL 0.5-2.5 (3) MG/3ML IN SOLN
RESPIRATORY_TRACT | Status: AC
  Administered 2016-10-31: 3 mL via RESPIRATORY_TRACT
  Filled 2016-10-31: qty 9

## 2016-10-31 MED ORDER — IPRATROPIUM-ALBUTEROL 0.5-2.5 (3) MG/3ML IN SOLN
3.0000 mL | RESPIRATORY_TRACT | Status: AC
  Administered 2016-10-31: 3 mL via RESPIRATORY_TRACT

## 2016-10-31 MED ORDER — METHYLPREDNISOLONE SODIUM SUCC 125 MG IJ SOLR
125.0000 mg | Freq: Once | INTRAMUSCULAR | Status: AC
  Administered 2016-10-31: 125 mg via INTRAVENOUS

## 2016-10-31 MED ORDER — ALBUTEROL SULFATE HFA 108 (90 BASE) MCG/ACT IN AERS: 2.0000 | INHALATION_SPRAY | Freq: Four times a day (QID) | RESPIRATORY_TRACT | 2 refills | Status: DC | PRN

## 2016-10-31 MED ORDER — AZITHROMYCIN 250 MG PO TABS: ORAL_TABLET | ORAL | 0 refills | Status: AC

## 2016-10-31 MED ORDER — PREDNISONE 20 MG PO TABS: 20.0000 mg | ORAL_TABLET | Freq: Every day | ORAL | 0 refills | Status: DC

## 2016-10-31 NOTE — ED Notes (Addendum)
While ambulating patient down hallway, O2 sats fell between 87% - 88% on Room Air. MD made aware

## 2016-10-31 NOTE — Progress Notes (Signed)
Best of 3 Peak Flow: 125L/M

## 2016-10-31 NOTE — Discharge Instructions (Signed)
I wish you would stay in the hospital. Please use your inhaler 4 x a day as needed. You can use it up to every 4 hours. If you need it more often you should return. Please take the prednisone every day and take the zithromax as directed. Try to get some help for your children in case you need to return and stay in the hospital. Try to follow up with the [facility], [facility], [facility] or the [facility] or the [facility] acute care clinic.

## 2016-10-31 NOTE — ED Notes (Signed)
Patient with X-ray att 

## 2016-10-31 NOTE — ED Triage Notes (Signed)
Patient with a history of asthma. Patient states that he started feeling short of breath 2 days ago. Patient states that he ran out of his inhaler. Patient with audible wheezes.

## 2016-10-31 NOTE — ED Provider Notes (Signed)
[facility] Emergency Department Provider Note   ____________________________________________   First MD Initiated Contact with Patient 10/31/16 [phone]     (approximate)  I have reviewed the triage vital signs and the nursing notes.   HISTORY  Chief Complaint Asthma    HPI Robert Vance is a 41 y.o. male who has a history of wheezing which she initially said was asthma E Simmons bronchitis. He ran out of his inhaler several days ago he's been feeling short of breath and wheezy for couple days. He denies any fever but says he's been coughing up. Which is whitish and yellowish. He is currently wheezing now.   Past Medical History:  Diagnosis Date  . Asthma     Patient Active Problem List   Diagnosis Date Noted  . Hypoxia 09/14/2016    History reviewed. No pertinent surgical history.  Prior to Admission medications   Medication Sig Start Date End Date Taking? Authorizing Provider  albuterol (PROVENTIL HFA;VENTOLIN HFA) 108 (90 Base) MCG/ACT inhaler Inhale 2 puffs into the lungs every 6 (six) hours as needed for wheezing or shortness of breath. 07/25/16  Yes Enid Derry, PA-C  predniSONE (DELTASONE) 20 MG tablet Take 3 tablets (60 mg total) by mouth daily. Patient not taking: Reported on 10/31/2016 09/14/16   Rebecka Apley, MD    Allergies Shellfish allergy  No family history on file.  Social History Social History  Substance Use Topics  . Smoking status: Never Smoker  . Smokeless tobacco: Never Used  . Alcohol use No    Review of Systems  Constitutional: No fever/chills Eyes: No visual changes. ENT: No sore throat. Cardiovascular: Denies chest pain. Respiratory: See history of present illness Gastrointestinal: No abdominal pain.  No nausea, no vomiting.  No diarrhea.  No constipation. Genitourinary: Negative for dysuria. Musculoskeletal: Negative for back pain. Skin: Negative for rash. Neurological: Negative for headaches,  focal weakness  ____________________________________________   PHYSICAL EXAM:  VITAL SIGNS: ED Triage Vitals  Enc Vitals Group     BP 10/31/16 0543 (!) 157/108     Pulse Rate 10/31/16 0543 (!) 103     Resp 10/31/16 0543 (!) 22     Temp 10/31/16 0543 98 F (36.7 C)     Temp Source 10/31/16 0543 Oral     SpO2 10/31/16 0543 90 %     Weight 10/31/16 0539 174 lb (78.9 kg)     Height 10/31/16 0539  (1.702 m)     Head Circumference --      Peak Flow --      Pain Score --      Pain Loc --      Pain Edu? --      Excl. in GC? --     Constitutional: Alert and oriented. Well appearing and in no acute distress. Eyes: Conjunctivae are normal.  Head: Atraumatic. Nose: No congestion/rhinnorhea. Mouth/Throat: Mucous membranes are moist.  Oropharynx non-erythematous. Neck: No stridor.  Cardiovascular: Normal rate, regular rhythm. Grossly normal heart sounds.  Good peripheral circulation. Respiratory: Normal respiratory effort.  No retractions. Lungs Diffuse scattered wheezes Gastrointestinal: Soft and nontender. No distention. No abdominal bruits. No CVA tenderness. Musculoskeletal: No lower extremity tenderness nor edema.  No joint effusions. Neurologic:  Normal speech and language. No gross focal neurologic deficits are appreciated.  Skin:  Skin is warm, dry and intact. No rash noted. Psychiatric: Mood and affect are normal. Speech and behavior are normal.  ____________________________________________   LABS (all labs ordered are  listed, but only abnormal results are displayed)  Labs Reviewed  CBC WITH DIFFERENTIAL/PLATELET - Abnormal; Notable for the following:       Result Value   Eosinophils Absolute 1.6 (*)    All other components within normal limits  COMPREHENSIVE METABOLIC PANEL - Abnormal; Notable for the following:    Glucose, Bld 101 (*)    All other components within normal limits    ____________________________________________  EKG   ____________________________________________  RADIOLOGY  Chest x-ray read by radiology as mild peribronchial thickening on review of the chest x-ray I do not see any pneumonia and agree with the radiologist ____________________________________________   PROCEDURES  Procedure(s) performed:   Procedures  Critical Care performed:   ____________________________________________   INITIAL IMPRESSION / ASSESSMENT AND PLAN / ED COURSE  As part of my medical decision making, I reviewed the following data within the electronic MEDICAL RECORD NUMBER   I reviewed his old records here and from care everywhere   patient desats when he walks. He is offered admission. He declines. He is a single father with 4 children at home. I explained to him people have died from asthma. He understands. He will come back if he is worse. I told him to call 911 if he is worse. he will try to get his sister to come down from [place].      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   Arnaldo Natal, MD 10/31/16 [phone]

## 2017-01-26 ENCOUNTER — Emergency Department: Payer: BLUE CROSS/BLUE SHIELD

## 2017-01-26 ENCOUNTER — Observation Stay
Admission: EM | Admit: 2017-01-26 | Discharge: 2017-01-28 | DRG: 202 | Disposition: A | Discharge status: Home / Self Care | Payer: BLUE CROSS/BLUE SHIELD | Attending: Internal Medicine | Admitting: Internal Medicine

## 2017-01-26 DIAGNOSIS — Z91013 Allergy to seafood: Secondary | ICD-10-CM

## 2017-01-26 DIAGNOSIS — R0602 Shortness of breath: Secondary | ICD-10-CM | POA: Diagnosis present

## 2017-01-26 DIAGNOSIS — R0902 Hypoxemia: Secondary | ICD-10-CM

## 2017-01-26 DIAGNOSIS — R000 Tachycardia, unspecified: Secondary | ICD-10-CM | POA: Diagnosis not present

## 2017-01-26 DIAGNOSIS — J209 Acute bronchitis, unspecified: Secondary | ICD-10-CM | POA: Diagnosis present

## 2017-01-26 DIAGNOSIS — J9601 Acute respiratory failure with hypoxia: Secondary | ICD-10-CM | POA: Diagnosis present

## 2017-01-26 DIAGNOSIS — J069 Acute upper respiratory infection, unspecified: Secondary | ICD-10-CM

## 2017-01-26 DIAGNOSIS — J45901 Unspecified asthma with (acute) exacerbation: Secondary | ICD-10-CM | POA: Diagnosis not present

## 2017-01-26 DIAGNOSIS — Z7984 Long term (current) use of oral hypoglycemic drugs: Secondary | ICD-10-CM | POA: Diagnosis not present

## 2017-01-26 DIAGNOSIS — Z825 Family history of asthma and other chronic lower respiratory diseases: Secondary | ICD-10-CM | POA: Diagnosis not present

## 2017-01-26 LAB — BASIC METABOLIC PANEL
Anion gap: 8 (ref 5–15)
BUN: 12 mg/dL (ref 6–20)
CO2: 28 mmol/L (ref 22–32)
Calcium: 9.6 mg/dL (ref 8.9–10.3)
Chloride: 103 mmol/L (ref 101–111)
Creatinine, Ser: 1.15 mg/dL (ref 0.61–1.24)
GFR calc Af Amer: >60 mL/min (ref >60)
GLUCOSE: 111 mg/dL — AB (ref 65–99)
POTASSIUM: 4.1 mmol/L (ref 3.5–5.1)
Sodium: 139 mmol/L (ref 135–145)

## 2017-01-26 LAB — CBC
HCT: 41.9 % (ref 40.0–52.0)
Hemoglobin: 13.6 g/dL (ref 13.0–18.0)
MCH: 27.7 pg (ref 26.0–34.0)
MCHC: 32.3 g/dL (ref 32.0–36.0)
MCV: 85.6 fL (ref 80.0–100.0)
PLATELETS: 340 10*3/uL (ref 150–440)
RBC: 4.9 MIL/uL (ref 4.40–5.90)
RDW: 13.9 % (ref 11.5–14.5)
WBC: 9.7 10*3/uL (ref 3.8–10.6)

## 2017-01-26 LAB — TROPONIN I: Troponin I: <0.03 ng/mL (ref <0.03)

## 2017-01-26 MED ORDER — AZITHROMYCIN 500 MG PO TABS
500.0000 mg | ORAL_TABLET | Freq: Once | ORAL | Status: AC
  Administered 2017-01-26: 500 mg via ORAL
  Filled 2017-01-26: qty 1

## 2017-01-26 MED ORDER — ALBUTEROL SULFATE (2.5 MG/3ML) 0.083% IN NEBU
2.5000 mg | INHALATION_SOLUTION | Freq: Once | RESPIRATORY_TRACT | Status: AC
  Administered 2017-01-26: 2.5 mg via RESPIRATORY_TRACT

## 2017-01-26 MED ORDER — PREDNISONE 20 MG PO TABS
60.0000 mg | ORAL_TABLET | Freq: Once | ORAL | Status: AC
  Administered 2017-01-26: 60 mg via ORAL

## 2017-01-26 MED ORDER — PREDNISONE 20 MG PO TABS
ORAL_TABLET | ORAL | Status: AC
  Administered 2017-01-26: 60 mg via ORAL
  Filled 2017-01-26: qty 3

## 2017-01-26 MED ORDER — IPRATROPIUM-ALBUTEROL 0.5-2.5 (3) MG/3ML IN SOLN
RESPIRATORY_TRACT | Status: AC
  Filled 2017-01-26: qty 3

## 2017-01-26 MED ORDER — ALBUTEROL SULFATE (2.5 MG/3ML) 0.083% IN NEBU
INHALATION_SOLUTION | RESPIRATORY_TRACT | Status: AC
  Administered 2017-01-26: 5 mg via RESPIRATORY_TRACT
  Filled 2017-01-26: qty 3

## 2017-01-26 MED ORDER — ALBUTEROL SULFATE (2.5 MG/3ML) 0.083% IN NEBU
5.0000 mg | INHALATION_SOLUTION | Freq: Once | RESPIRATORY_TRACT | Status: AC
  Administered 2017-01-26: 5 mg via RESPIRATORY_TRACT

## 2017-01-26 MED ORDER — IPRATROPIUM-ALBUTEROL 0.5-2.5 (3) MG/3ML IN SOLN
3.0000 mL | Freq: Once | RESPIRATORY_TRACT | Status: AC
  Administered 2017-01-26: 3 mL via RESPIRATORY_TRACT
  Filled 2017-01-26: qty 3

## 2017-01-26 MED ORDER — ALBUTEROL (5 MG/ML) CONTINUOUS INHALATION SOLN: 10.0000 mg/h | INHALATION_SOLUTION | Freq: Once | RESPIRATORY_TRACT | Status: DC

## 2017-01-26 MED ORDER — AZITHROMYCIN 500 MG PO TABS
500.0000 mg | ORAL_TABLET | Freq: Every day | ORAL | Status: DC
  Administered 2017-01-27 – 2017-01-28 (×2): 500 mg via ORAL
  Filled 2017-01-26 (×2): qty 1

## 2017-01-26 MED ORDER — ALBUTEROL SULFATE (2.5 MG/3ML) 0.083% IN NEBU
INHALATION_SOLUTION | RESPIRATORY_TRACT | Status: AC
  Administered 2017-01-26: 2.5 mg via RESPIRATORY_TRACT
  Filled 2017-01-26: qty 12

## 2017-01-26 MED ORDER — IPRATROPIUM-ALBUTEROL 0.5-2.5 (3) MG/3ML IN SOLN
3.0000 mL | Freq: Four times a day (QID) | RESPIRATORY_TRACT | Status: DC
  Administered 2017-01-27 – 2017-01-28 (×5): 3 mL via RESPIRATORY_TRACT
  Filled 2017-01-26 (×5): qty 3

## 2017-01-26 MED ORDER — METHYLPREDNISOLONE SODIUM SUCC 40 MG IJ SOLR
40.0000 mg | Freq: Three times a day (TID) | INTRAMUSCULAR | Status: DC
Start: 2017-01-27 — End: 2017-01-27

## 2017-01-26 MED ORDER — IPRATROPIUM-ALBUTEROL 0.5-2.5 (3) MG/3ML IN SOLN
3.0000 mL | Freq: Once | RESPIRATORY_TRACT | Status: AC
  Administered 2017-01-26: 3 mL via RESPIRATORY_TRACT

## 2017-01-26 NOTE — ED Triage Notes (Signed)
Patient c/o SOB beginning yesterday. Patient's respirations labored with audible wheezing heard all fields.

## 2017-01-26 NOTE — ED Notes (Signed)
Pt placed on 2L nasal cannula to maintain saturation >92%.

## 2017-01-26 NOTE — ED Provider Notes (Signed)
[facility] Emergency Department Provider Note  Time seen: 9:06 PM  I have reviewed the triage vital signs and the nursing notes.   HISTORY  Chief Complaint Shortness of Breath    HPI Robert Vance is a 42 y.o. male with a past medical history of asthma who presents to the emergency department for shortness of breath and cough.  According to the patient for the past 3 days he has had cough, congestion and worsening shortness of breath.  Patient states he was out of albuterol however he refilled his inhaler this morning, used it several times but felt minimal relief so he came to the emergency department for evaluation.  Upon arrival the patient has audible wheeze was given a breathing treatment in triage and sent back to his room shortly afterwards.  Patient denies any fever, has been coughing with some mild abdominal discomfort in the upper abdomen from coughing per patient.  Denies any vomiting.  Describes her shortness of breath is mild currently although continues to have audible wheeze.   Past Medical History:  Diagnosis Date  . Asthma     Patient Active Problem List   Diagnosis Date Noted  . Hypoxia 09/14/2016    History reviewed. No pertinent surgical history.  Prior to Admission medications   Medication Sig Start Date End Date Taking? Authorizing Provider  albuterol (PROVENTIL HFA;VENTOLIN HFA) 108 (90 Base) MCG/ACT inhaler Inhale 2 puffs into the lungs every 6 (six) hours as needed for wheezing or shortness of breath. 07/25/16   Enid DerryWagner, Ashley, PA-C  albuterol (PROVENTIL HFA;VENTOLIN HFA) 108 (90 Base) MCG/ACT inhaler Inhale 2 puffs into the lungs every 6 (six) hours as needed for wheezing or shortness of breath. 10/31/16   Arnaldo NatalMalinda, Paul F, MD  predniSONE (DELTASONE) 20 MG tablet Take 3 tablets (60 mg total) by mouth daily. Patient not taking: Reported on 10/31/2016 09/14/16   Rebecka ApleyWebster, Allison P, MD  predniSONE (DELTASONE) 20 MG tablet Take 1 tablet (20  mg total) by mouth daily. 10/31/16 10/31/17  Arnaldo NatalMalinda, Paul F, MD    Allergies  Allergen Reactions  . Shellfish Allergy     No family history on file.  Social History Social History   Tobacco Use  . Smoking status: Never Smoker  . Smokeless tobacco: Never Used  Substance Use Topics  . Alcohol use: No  . Drug use: Not on file    Review of Systems Constitutional: Negative for fever. Eyes: Negative for visual complaints. ENT: Positive for congestion Cardiovascular: Negative for chest pain. Respiratory: Positive shortness of breath.  Positive for cough. Gastrointestinal: Negative for abdominal pain, vomiting  Genitourinary: No urinary complaints. Musculoskeletal: Negative for swelling Skin: Negative for rash. Neurological: Negative for headache All other ROS negative  ____________________________________________   PHYSICAL EXAM:  VITAL SIGNS: ED Triage Vitals  Enc Vitals Group     BP 01/26/17 1926 131/78     Pulse Rate 01/26/17 1926 (!) 105     Resp 01/26/17 1926 (!) 22     Temp 01/26/17 1926 98 F (36.7 C)     Temp Source 01/26/17 1926 Axillary     SpO2 01/26/17 1926 100 %     Weight 01/26/17 1927 175 lb (79.4 kg)     Height --      Head Circumference --      Peak Flow --      Pain Score 01/26/17 1925 0     Pain Loc --      Pain Edu? --  Excl. in GC? --    Constitutional: Alert and oriented. Well appearing and in no distress, but with audible wheeze. Eyes: Normal exam ENT   Head: Normocephalic and atraumatic   Mouth/Throat: Mucous membranes are moist. Cardiovascular: Normal rate, regular rhythm around 100 bpm.  No murmur. Respiratory: Mild tachypnea, mild inspiratory moderate expiratory wheezes in all lung fields. Gastrointestinal: Soft and nontender. No distention.   Musculoskeletal: Nontender with normal range of motion in all extremities. No lower extremity tenderness or edema. Neurologic:  Normal speech and language. No gross focal neurologic  deficits  Skin:  Skin is warm, dry and intact.  Psychiatric: Mood and affect are normal.  ____________________________________________    EKG  EKG reviewed and interpreted by myself shows sinus tachycardia 109 bpm with a narrow QRS, normal axis, normal intervals, no concerning ST changes.  ____________________________________________    RADIOLOGY  Chest x-ray shows peribronchial thickening without pneumonia.  ____________________________________________   INITIAL IMPRESSION / ASSESSMENT AND PLAN / ED COURSE  Pertinent labs & imaging results that were available during my care of the patient were reviewed by me and considered in my medical decision making (see chart for details).  Patient presents to the emergency department for cough, congestion and shortness of breath worsening over the past 3 days.  Differential would include asthma exacerbation, upper respiratory infection, pneumonia, bronchitis.  Patient's chest x-ray shows peribronchial thickening, patient likely experiencing upper respiratory infection exacerbating underlying asthma.  Labs are largely within normal limits.  EKG is reassuring.  Patient received albuterol in triage continues to have audible wheeze dose to DuoNeb in the emergency department we will continue to monitor.  Patient continues to have wheeze although improved from earlier per patient.  States his breathing is much improved but continues have significant wheeze on my examination we will place on albuterol for 1 hour and dose prednisone and then continue to monitor in the emergency department.  Patient agreeable to this plan of care.  ----------------------------------------- 10:14 PM on 01/26/2017 -----------------------------------------  Patient continues to have significant wheeze.  I checked his pulse ox he is currently 88% on room air with a great waveform.  The patient has received a DuoNeb, multiple albuterol nebulized treatments, prednisone.  I  discussed with the patient the need to admit him to the hospital for continued treatment.  Patient is adamantly against this, states he has 4 kids at home and needs to go home.  He states the DuoNeb is what helped him the most.  We will cover him with antibiotics, Zithromax we will dose another DuoNeb and continue to closely monitor, but currently the patient is hypoxic 88% on room air.  ----------------------------------------- 10:45 PM on 01/26/2017 -----------------------------------------  Patient continues to have an 88% room air saturation with a good waveform.  Continues to have expiratory wheezes.  I once again discussed with the patient the need to admit him to the hospital.  He is calling his family to see if that is a possibility.  Patient agreeable to admission to the hospital.  ____________________________________________   FINAL CLINICAL IMPRESSION(S) / ED DIAGNOSES  Asthma exacerbation Upper respiratory infection Hypoxia   Minna Antis, MD 01/26/17 2313

## 2017-01-27 ENCOUNTER — Encounter: Payer: Self-pay | Admitting: Internal Medicine

## 2017-01-27 ENCOUNTER — Other Ambulatory Visit: Payer: Self-pay

## 2017-01-27 LAB — BASIC METABOLIC PANEL
Anion gap: 12 (ref 5–15)
BUN: 11 mg/dL (ref 6–20)
CALCIUM: 10.1 mg/dL (ref 8.9–10.3)
CO2: 24 mmol/L (ref 22–32)
Chloride: 102 mmol/L (ref 101–111)
Creatinine, Ser: 1.06 mg/dL (ref 0.61–1.24)
GLUCOSE: 149 mg/dL — AB (ref 65–99)
Potassium: 5.1 mmol/L (ref 3.5–5.1)
SODIUM: 138 mmol/L (ref 135–145)

## 2017-01-27 LAB — CBC
HCT: 42.4 % (ref 40.0–52.0)
Hemoglobin: 14 g/dL (ref 13.0–18.0)
MCH: 28.5 pg (ref 26.0–34.0)
MCHC: 33.1 g/dL (ref 32.0–36.0)
MCV: 86.1 fL (ref 80.0–100.0)
PLATELETS: 329 10*3/uL (ref 150–440)
RBC: 4.92 MIL/uL (ref 4.40–5.90)
RDW: 14.4 % (ref 11.5–14.5)
WBC: 6.5 10*3/uL (ref 3.8–10.6)

## 2017-01-27 LAB — INFLUENZA PANEL BY PCR (TYPE A & B)
INFLAPCR: NEGATIVE
Influenza B By PCR: NEGATIVE

## 2017-01-27 MED ORDER — ONDANSETRON HCL 4 MG PO TABS: 4.0000 mg | ORAL_TABLET | Freq: Four times a day (QID) | ORAL | Status: DC | PRN

## 2017-01-27 MED ORDER — PREDNISONE 50 MG PO TABS
50.0000 mg | ORAL_TABLET | Freq: Every day | ORAL | Status: DC
  Administered 2017-01-27 – 2017-01-28 (×2): 50 mg via ORAL
  Filled 2017-01-27 (×2): qty 1

## 2017-01-27 MED ORDER — SODIUM CHLORIDE 0.9 % IV SOLN: 250.0000 mL | INTRAVENOUS | Status: DC | PRN

## 2017-01-27 MED ORDER — SODIUM CHLORIDE 0.9% FLUSH: 3.0000 mL | INTRAVENOUS | Status: DC | PRN

## 2017-01-27 MED ORDER — GUAIFENESIN ER 600 MG PO TB12
600.0000 mg | ORAL_TABLET | Freq: Two times a day (BID) | ORAL | Status: DC
  Administered 2017-01-27 – 2017-01-28 (×4): 600 mg via ORAL
  Filled 2017-01-27 (×4): qty 1

## 2017-01-27 MED ORDER — ONDANSETRON HCL 4 MG/2ML IJ SOLN: 4.0000 mg | Freq: Four times a day (QID) | INTRAMUSCULAR | Status: DC | PRN

## 2017-01-27 MED ORDER — ACETAMINOPHEN 650 MG RE SUPP: 650.0000 mg | Freq: Four times a day (QID) | RECTAL | Status: DC | PRN

## 2017-01-27 MED ORDER — SENNOSIDES-DOCUSATE SODIUM 8.6-50 MG PO TABS: 1.0000 | ORAL_TABLET | Freq: Every evening | ORAL | Status: DC | PRN

## 2017-01-27 MED ORDER — ALBUTEROL SULFATE (2.5 MG/3ML) 0.083% IN NEBU
2.5000 mg | INHALATION_SOLUTION | RESPIRATORY_TRACT | Status: DC | PRN
Start: 2017-01-27 — End: 2017-01-28

## 2017-01-27 MED ORDER — ACETAMINOPHEN 325 MG PO TABS
650.0000 mg | ORAL_TABLET | Freq: Four times a day (QID) | ORAL | Status: DC | PRN
  Administered 2017-01-27: 650 mg via ORAL
  Filled 2017-01-27: qty 2

## 2017-01-27 MED ORDER — SODIUM CHLORIDE 0.9% FLUSH
3.0000 mL | Freq: Two times a day (BID) | INTRAVENOUS | Status: DC
  Administered 2017-01-27 – 2017-01-28 (×3): 3 mL via INTRAVENOUS

## 2017-01-27 MED ORDER — ENOXAPARIN SODIUM 40 MG/0.4ML ~~LOC~~ SOLN
40.0000 mg | SUBCUTANEOUS | Status: DC
  Filled 2017-01-27: qty 0.4

## 2017-01-27 NOTE — Progress Notes (Signed)
[facility] - [place] at [facility]   PATIENT NAME: Robert Vance    MR#:  [id number]  DATE OF BIRTH:  03-24-1975  SUBJECTIVE:  CHIEF COMPLAINT:   Chief Complaint  Patient presents with  . Shortness of Breath   Have reactive airway disease, never diagnosed with any allergy; but had appointment with allergy specialist scheduled this week. Came with worsening shortness of breath and cough for the last few days. Admitted with asthma exacerbation. Feels slightly better, still have severe tachycardia on minimal exertion and requiring 2 L oxygen supplementation. REVIEW OF SYSTEMS:  CONSTITUTIONAL: No fever, fatigue or weakness.  EYES: No blurred or double vision.  EARS, NOSE, AND THROAT: No tinnitus or ear pain.  RESPIRATORY: Positive for cough, shortness of breath, wheezing , no hemoptysis.  CARDIOVASCULAR: No chest pain, orthopnea, edema.  GASTROINTESTINAL: No nausea, vomiting, diarrhea or abdominal pain.  GENITOURINARY: No dysuria, hematuria.  ENDOCRINE: No polyuria, nocturia,  HEMATOLOGY: No anemia, easy bruising or bleeding SKIN: No rash or lesion. MUSCULOSKELETAL: No joint pain or arthritis.   NEUROLOGIC: No tingling, numbness, weakness.  PSYCHIATRY: No anxiety or depression.   ROS  DRUG ALLERGIES:   Allergies  Allergen Reactions  . Shellfish Allergy     VITALS:  Blood pressure 139/81, pulse (!) 112, temperature 98.5 F (36.9 C), temperature source Oral, resp. rate 20, height 5\' 7"  (1.702 m), weight 79.4 kg (175 lb), SpO2 94 %.  PHYSICAL EXAMINATION:  GENERAL:  42 y.o.-year-old patient lying in the bed with no acute distress.  EYES: Pupils equal, round, reactive to light and accommodation. No scleral icterus. Extraocular muscles intact.  HEENT: Head atraumatic, normocephalic. Oropharynx and nasopharynx clear.  NECK:  Supple, no jugular venous distention. No thyroid enlargement, no tenderness.  LUNGS: Normal breath sounds bilaterally, he have wheezing,  no crepitation. No use of accessory muscles of respiration.  CARDIOVASCULAR: S1, S2 normal. No murmurs, rubs, or gallops.  ABDOMEN: Soft, nontender, nondistended. Bowel sounds present. No organomegaly or mass.  EXTREMITIES: No pedal edema, cyanosis, or clubbing.  NEUROLOGIC: Cranial nerves II through XII are intact. Muscle strength 5/5 in all extremities. Sensation intact. Gait not checked.  PSYCHIATRIC: The patient is alert and oriented x 3.  SKIN: No obvious rash, lesion, or ulcer.   Physical Exam LABORATORY PANEL:   CBC Recent Labs  Lab 01/27/17 0507  WBC 6.5  HGB 14.0  HCT 42.4  PLT 329   ------------------------------------------------------------------------------------------------------------------  Chemistries  Recent Labs  Lab 01/27/17 0507  NA 138  K 5.1  CL 102  CO2 24  GLUCOSE 149*  BUN 11  CREATININE 1.06  CALCIUM 10.1   ------------------------------------------------------------------------------------------------------------------  Cardiac Enzymes Recent Labs  Lab 01/26/17 1927  TROPONINI <0.03   ------------------------------------------------------------------------------------------------------------------  RADIOLOGY:  Dg Chest 2 View  Result Date: 01/26/2017 CLINICAL DATA:  Dyspnea with audible wheezing. EXAM: CHEST  2 VIEW COMPARISON:  None. FINDINGS: The heart size and mediastinal contours are within normal limits. Both lungs are clear. Mild peribronchial thickening is re- demonstrated consistent small airway inflammation. The visualized skeletal structures are unremarkable. IMPRESSION: Peribronchial thickening suggesting small airway inflammation. No pneumonic consolidations. Electronically Signed   By: Tollie Eth M.D.   On: 01/26/2017 20:05    ASSESSMENT AND PLAN:   Active Problems:   Asthma exacerbation  * Acute hypoxic respiratory failure   Asthma exacerbation   History reactive airway disease   Acute bronchitis    Continue  supplemental oxygen, for outpatient negative.   Continue nebulizer therapy and  antibiotics.   Continue oral steroids.    All the records are reviewed and case discussed with Care Management/Social Workerr. Management plans discussed with the patient, family and they are in agreement.  CODE STATUS: Full code.  TOTAL TIME TAKING CARE OF THIS PATIENT: 35 minutes.     POSSIBLE D/C IN 1-2 DAYS, DEPENDING ON CLINICAL CONDITION.   Altamese DillingVaibhavkumar Eleonore Shippee M.D on 01/27/2017   Between 7am to 6pm - Pager - [phone]  After 6pm go to [email or web address] - password [email or web address]  [facility] [place] [facility]  Office  [phone]  CC: Primary care physician; [place], [facility]  Note: This dictation was prepared with Dragon dictation along with smaller phrase technology. Any transcriptional errors that result from this process are unintentional.

## 2017-01-27 NOTE — ED Notes (Signed)
RN unavailable to take report at this time

## 2017-01-27 NOTE — ED Notes (Signed)
Hospitalist to bedside at this time 

## 2017-01-27 NOTE — H&P (Signed)
[facility] [place] at [facility]   PATIENT NAME: Robert Vance    MR#:  [id number]  DATE OF BIRTH:  Jan 26, 1975  DATE OF ADMISSION:  01/26/2017  PRIMARY CARE PHYSICIAN: [place], [facility]   REQUESTING/REFERRING PHYSICIAN:   CHIEF COMPLAINT:   Chief Complaint  Patient presents with  . Shortness of Breath    HISTORY OF PRESENT ILLNESS: Robert Fortslfonso Soberanes  is a 42 y.o. male with a known history of reactive airway disease, bronchitis presented to the emergency room with shortness of breath for the last couple of days.  Patient has occasional cough no fever.  He also said he had some wheezing for the last 2 days.  He has been treated for reactive airway disease in the past and bronchitis.  Patient was evaluated in the emergency room was given oral prednisone and nebulization treatment.  His oxygen saturation was 88% on room air.  Flu test pending.  No recent travel, sick contacts at home.  Hospitalist service was consulted.  No complaints of any chest pain.   PAST MEDICAL HISTORY:   Past Medical History:  Diagnosis Date  . Asthma     PAST SURGICAL HISTORY:  Past Surgical History:  Procedure Laterality Date  . none      SOCIAL HISTORY:  Social History   Tobacco Use  . Smoking status: Never Smoker  . Smokeless tobacco: Never Used  Substance Use Topics  . Alcohol use: No    FAMILY HISTORY:  Family History  Problem Relation Age of Onset  . COPD Father   . CAD Neg Hx   . Diabetes Mellitus II Neg Hx   . Hypertension Neg Hx     DRUG ALLERGIES:  Allergies  Allergen Reactions  . Shellfish Allergy     REVIEW OF SYSTEMS:   CONSTITUTIONAL: No fever, fatigue or weakness.  EYES: No blurred or double vision.  EARS, NOSE, AND THROAT: No tinnitus or ear pain.  RESPIRATORY: Has  cough, shortness of breath, wheezing  No hemoptysis.  CARDIOVASCULAR: No chest pain, orthopnea, edema.  GASTROINTESTINAL: No nausea, vomiting, diarrhea or  abdominal pain.  GENITOURINARY: No dysuria, hematuria.  ENDOCRINE: No polyuria, nocturia,  HEMATOLOGY: No anemia, easy bruising or bleeding SKIN: No rash or lesion. MUSCULOSKELETAL: No joint pain or arthritis.   NEUROLOGIC: No tingling, numbness, weakness.  PSYCHIATRY: No anxiety or depression.   MEDICATIONS AT HOME:  Prior to Admission medications   Medication Sig Start Date End Date Taking? Authorizing Provider  albuterol (PROVENTIL HFA;VENTOLIN HFA) 108 (90 Base) MCG/ACT inhaler Inhale 2 puffs into the lungs every 6 (six) hours as needed for wheezing or shortness of breath. 07/25/16  Yes Enid DerryWagner, Ashley, PA-C  albuterol (PROVENTIL HFA;VENTOLIN HFA) 108 (90 Base) MCG/ACT inhaler Inhale 2 puffs into the lungs every 6 (six) hours as needed for wheezing or shortness of breath. 10/31/16   Arnaldo NatalMalinda, Paul F, MD  metFORMIN (GLUCOPHAGE) 500 MG tablet Take 1 tablet by mouth 2 (two) times daily. 01/12/17   [provider]  predniSONE (DELTASONE) 20 MG tablet Take 3 tablets (60 mg total) by mouth daily. Patient not taking: Reported on 10/31/2016 09/14/16   Rebecka ApleyWebster, Allison P, MD  predniSONE (DELTASONE) 20 MG tablet Take 1 tablet (20 mg total) by mouth daily. Patient not taking: Reported on 01/26/2017 10/31/16 10/31/17  Arnaldo NatalMalinda, Paul F, MD      PHYSICAL EXAMINATION:   VITAL SIGNS: Blood pressure (!) 137/97, pulse (!) 115, temperature 98 F (36.7 C), temperature source  Axillary, resp. rate (!) 22, weight 79.4 kg (175 lb), SpO2 94 %.  GENERAL:  42 y.o.-year-old patient lying in the bed with no acute distress.  EYES: Pupils equal, round, reactive to light and accommodation. No scleral icterus. Extraocular muscles intact.  HEENT: Head atraumatic, normocephalic. Oropharynx and nasopharynx clear.  NECK:  Supple, no jugular venous distention. No thyroid enlargement, no tenderness.  LUNGS: Decreased breath sounds bilaterally, bilateral wheezing, rales. No use of accessory muscles of respiration.   CARDIOVASCULAR: S1, S2 tachycardia noted. No murmurs, rubs, or gallops.  ABDOMEN: Soft, nontender, nondistended. Bowel sounds present. No organomegaly or mass.  EXTREMITIES: No pedal edema, cyanosis, or clubbing.  NEUROLOGIC: Cranial nerves II through XII are intact. Muscle strength 5/5 in all extremities. Sensation intact. Gait not checked.  PSYCHIATRIC: The patient is alert and oriented x 3.  SKIN: No obvious rash, lesion, or ulcer.   LABORATORY PANEL:   CBC Recent Labs  Lab 01/26/17 1927  WBC 9.7  HGB 13.6  HCT 41.9  PLT 340  MCV 85.6  MCH 27.7  MCHC 32.3  RDW 13.9   ------------------------------------------------------------------------------------------------------------------  Chemistries  Recent Labs  Lab 01/26/17 1927  NA 139  K 4.1  CL 103  CO2 28  GLUCOSE 111*  BUN 12  CREATININE 1.15  CALCIUM 9.6   ------------------------------------------------------------------------------------------------------------------ estimated creatinine clearance is 83.6 mL/min (by C-G formula based on SCr of 1.15 mg/dL). ------------------------------------------------------------------------------------------------------------------ No results for input(s): TSH, T4TOTAL, T3FREE, THYROIDAB in the last 72 hours.  Invalid input(s): FREET3   Coagulation profile No results for input(s): INR, PROTIME in the last 168 hours. ------------------------------------------------------------------------------------------------------------------- No results for input(s): DDIMER in the last 72 hours. -------------------------------------------------------------------------------------------------------------------  Cardiac Enzymes Recent Labs  Lab 01/26/17 1927  TROPONINI <0.03   ------------------------------------------------------------------------------------------------------------------ Invalid input(s):  POCBNP  ---------------------------------------------------------------------------------------------------------------  Urinalysis No results found for: COLORURINE, APPEARANCEUR, LABSPEC, PHURINE, GLUCOSEU, HGBUR, BILIRUBINUR, KETONESUR, PROTEINUR, UROBILINOGEN, NITRITE, LEUKOCYTESUR   RADIOLOGY: Dg Chest 2 View  Result Date: 01/26/2017 CLINICAL DATA:  Dyspnea with audible wheezing. EXAM: CHEST  2 VIEW COMPARISON:  None. FINDINGS: The heart size and mediastinal contours are within normal limits. Both lungs are clear. Mild peribronchial thickening is re- demonstrated consistent small airway inflammation. The visualized skeletal structures are unremarkable. IMPRESSION: Peribronchial thickening suggesting small airway inflammation. No pneumonic consolidations. Electronically Signed   By: Tollie Eth M.D.   On: 01/26/2017 20:05    EKG: Orders placed or performed during the hospital encounter of 01/26/17  . ED EKG  . ED EKG    IMPRESSION AND PLAN: 42 year old male patient with history of reactive airway disease, bronchitis presented to the emergency room with cough shortness of breath and wheezing.  Admitting diagnosis 1.  Reactive airway disease 2.  Bronchial asthma exacerbation 3.  Hypoxia 4.  Acute bronchitis Treatment plan Admit patient to medical floor Oxygen via nasal cannula Flu test Nebulization treatments aggressively Start patient on steroids Oral Zithromax antibiotic  All the records are reviewed and case discussed with ED provider. Management plans discussed with the patient, family and they are in agreement.  CODE STATUS: Full code Code Status History    This patient does not have a recorded code status. Please follow your organizational policy for patients in this situation.       TOTAL TIME TAKING CARE OF THIS PATIENT: 50 minutes.    Ihor Austin M.D on 01/27/2017 at 12:16 AM  Between 7am to 6pm - Pager - [phone]  After 6pm go to [email or web address] -  password [email or web address]  [facility]  Office  [phone]  CC: Primary care physician; [place], [place] [facility]

## 2017-01-28 LAB — HIV ANTIBODY (ROUTINE TESTING W REFLEX): HIV Screen 4th Generation wRfx: NONREACTIVE

## 2017-01-28 MED ORDER — PREDNISONE 50 MG PO TABS: 50.0000 mg | ORAL_TABLET | Freq: Every day | ORAL | 0 refills | Status: AC

## 2017-01-28 MED ORDER — AZITHROMYCIN 500 MG PO TABS: 500.0000 mg | ORAL_TABLET | Freq: Every day | ORAL | 0 refills | Status: AC

## 2017-01-28 MED ORDER — METHYLPREDNISOLONE SODIUM SUCC 125 MG IJ SOLR
60.0000 mg | Freq: Once | INTRAMUSCULAR | Status: AC
  Administered 2017-01-28: 60 mg via INTRAVENOUS
  Filled 2017-01-28: qty 2

## 2017-01-28 MED ORDER — IPRATROPIUM-ALBUTEROL 0.5-2.5 (3) MG/3ML IN SOLN
3.0000 mL | RESPIRATORY_TRACT | Status: DC
  Administered 2017-01-28 (×3): 3 mL via RESPIRATORY_TRACT
  Filled 2017-01-28 (×3): qty 3

## 2017-01-28 MED ORDER — BUDESONIDE 0.5 MG/2ML IN SUSP
0.5000 mg | Freq: Two times a day (BID) | RESPIRATORY_TRACT | Status: DC
  Administered 2017-01-28: 0.5 mg via RESPIRATORY_TRACT
  Filled 2017-01-28: qty 2

## 2017-01-28 NOTE — Discharge Summary (Signed)
[facility] - [place] at [facility]   PATIENT NAME: Robert Vance    MR#:  [id number]  DATE OF BIRTH:  06/03/1975  DATE OF ADMISSION:  01/26/2017 ADMITTING PHYSICIAN: Ihor Austin, MD  DATE OF DISCHARGE: 01/28/2017   PRIMARY CARE PHYSICIAN: [place], [place] [facility]    ADMISSION DIAGNOSIS:  Hypoxia [R09.02] Upper respiratory tract infection, unspecified type [J06.9] Moderate asthma with exacerbation, unspecified whether persistent [J45.901]  DISCHARGE DIAGNOSIS:  Active Problems:   Asthma exacerbation   SECONDARY DIAGNOSIS:   Past Medical History:  Diagnosis Date  . Asthma     HOSPITAL COURSE:   * Acute hypoxic respiratory failure   Asthma exacerbation   History reactive airway disease   Acute bronchitis    Continue supplemental oxygen, for outpatient negative.   Continue nebulizer therapy and antibiotics.   Continue oral steroids.  felt better, next day- on room air and ambulated/ stable on room air.    DISCHARGE CONDITIONS:   Stable.  CONSULTS OBTAINED:    DRUG ALLERGIES:   Allergies  Allergen Reactions  . Shellfish Allergy     DISCHARGE MEDICATIONS:   Allergies as of 01/28/2017      Reactions   Shellfish Allergy       Medication List    TAKE these medications   albuterol 108 (90 Base) MCG/ACT inhaler Commonly known as:  PROVENTIL HFA;VENTOLIN HFA Inhale 2 puffs into the lungs every 6 (six) hours as needed for wheezing or shortness of breath. What changed:  Another medication with the same name was removed. Continue taking this medication, and follow the directions you see here.   azithromycin 500 MG tablet Commonly known as:  ZITHROMAX Take 1 tablet (500 mg total) by mouth daily for 2 days. Start taking on:  01/29/2017   metFORMIN 500 MG tablet Commonly known as:  GLUCOPHAGE Take 1 tablet by mouth 2 (two) times daily.   predniSONE 50 MG tablet Commonly known as:  DELTASONE Take 1 tablet (50 mg total)  by mouth daily with breakfast for 2 days. What changed:    medication strength  how much to take  when to take this  Another medication with the same name was removed. Continue taking this medication, and follow the directions you see here.        DISCHARGE INSTRUCTIONS:    Follow with PMD in 1-2 weeks.  If you experience worsening of your admission symptoms, develop shortness of breath, life threatening emergency, suicidal or homicidal thoughts you must seek medical attention immediately by calling 911 or calling your MD immediately  if symptoms less severe.  You Must read complete instructions/literature along with all the possible adverse reactions/side effects for all the Medicines you take and that have been prescribed to you. Take any new Medicines after you have completely understood and accept all the possible adverse reactions/side effects.   Please note  You were cared for by a hospitalist during your hospital stay. If you have any questions about your discharge medications or the care you received while you were in the hospital after you are discharged, you can call the unit and asked to speak with the hospitalist on call if the hospitalist that took care of you is not available. Once you are discharged, your primary care physician will handle any further medical issues. Please note that NO REFILLS for any discharge medications will be authorized once you are discharged, as it is imperative that you return to your primary care physician (or establish  a relationship with a primary care physician if you do not have one) for your aftercare needs so that they can reassess your need for medications and monitor your lab values.    Today   CHIEF COMPLAINT:   Chief Complaint  Patient presents with  . Shortness of Breath    HISTORY OF PRESENT ILLNESS:  Robert Vance  is a 42 y.o. male with a known history of reactive airway disease, bronchitis presented to the emergency  room with shortness of breath for the last couple of days.  Patient has occasional cough no fever.  He also said he had some wheezing for the last 2 days.  He has been treated for reactive airway disease in the past and bronchitis.  Patient was evaluated in the emergency room was given oral prednisone and nebulization treatment.  His oxygen saturation was 88% on room air.  Flu test pending.  No recent travel, sick contacts at home.  Hospitalist service was consulted.  No complaints of any chest pain.   VITAL SIGNS:  Blood pressure 117/78, pulse (!) 116, temperature 98.6 F (37 C), temperature source Oral, resp. rate 20, height 5\' 7"  (1.702 m), weight 79.4 kg (175 lb), SpO2 94 %.  I/O:    Intake/Output Summary (Last 24 hours) at 01/28/2017 1538 Last data filed at 01/28/2017 1300 Gross per 24 hour  Intake 240 ml  Output -  Net 240 ml    PHYSICAL EXAMINATION:  GENERAL:  43 y.o.-year-old patient lying in the bed with no acute distress.  EYES: Pupils equal, round, reactive to light and accommodation. No scleral icterus. Extraocular muscles intact.  HEENT: Head atraumatic, normocephalic. Oropharynx and nasopharynx clear.  NECK:  Supple, no jugular venous distention. No thyroid enlargement, no tenderness.  LUNGS: Normal breath sounds bilaterally, no wheezing, rales,rhonchi or crepitation. No use of accessory muscles of respiration.  CARDIOVASCULAR: S1, S2 normal. No murmurs, rubs, or gallops.  ABDOMEN: Soft, non-tender, non-distended. Bowel sounds present. No organomegaly or mass.  EXTREMITIES: No pedal edema, cyanosis, or clubbing.  NEUROLOGIC: Cranial nerves II through XII are intact. Muscle strength 5/5 in all extremities. Sensation intact. Gait not checked.  PSYCHIATRIC: The patient is alert and oriented x 3.  SKIN: No obvious rash, lesion, or ulcer.   DATA REVIEW:   CBC Recent Labs  Lab 01/27/17 0507  WBC 6.5  HGB 14.0  HCT 42.4  PLT 329    Chemistries  Recent Labs  Lab  01/27/17 0507  NA 138  K 5.1  CL 102  CO2 24  GLUCOSE 149*  BUN 11  CREATININE 1.06  CALCIUM 10.1    Cardiac Enzymes Recent Labs  Lab 01/26/17 1927  TROPONINI <0.03    Microbiology Results  No results found for this or any previous visit.  RADIOLOGY:  Dg Chest 2 View  Result Date: 01/26/2017 CLINICAL DATA:  Dyspnea with audible wheezing. EXAM: CHEST  2 VIEW COMPARISON:  None. FINDINGS: The heart size and mediastinal contours are within normal limits. Both lungs are clear. Mild peribronchial thickening is re- demonstrated consistent small airway inflammation. The visualized skeletal structures are unremarkable. IMPRESSION: Peribronchial thickening suggesting small airway inflammation. No pneumonic consolidations. Electronically Signed   By: Tollie Eth M.D.   On: 01/26/2017 20:05    EKG:   Orders placed or performed during the hospital encounter of 01/26/17  . ED EKG  . ED EKG      Management plans discussed with the patient, family and they are in agreement.  CODE STATUS:     Code Status Orders  (From admission, onward)        Start     Ordered   01/27/17 0138  Full code  Continuous     01/27/17 0137    Code Status History    Date Active Date Inactive Code Status Order ID Comments User Context   This patient has a current code status but no historical code status.      TOTAL TIME TAKING CARE OF THIS PATIENT: 35 minutes.    Altamese DillingVaibhavkumar Zethan Alfieri M.D on 01/28/2017 at 3:38 PM  Between 7am to 6pm - Pager - [phone]  After 6pm go to [email or web address] - password [email or web address]  [facility] [place] [facility]  Office  [phone]  CC: Primary care physician; [place], [facility]   Note: This dictation was prepared with Dragon dictation along with smaller phrase technology. Any transcriptional errors that result from this process are unintentional.

## 2017-01-28 NOTE — Plan of Care (Signed)
  Progressing Education: Knowledge of General Education information will improve 01/28/2017 1142 - Progressing by Tomie ChinaJackson, Maryalice Pasley Cecelie, RN Health Behavior/Discharge Planning: Ability to manage health-related needs will improve 01/28/2017 1142 - Progressing by Tomie ChinaJackson, Ysabel Stankovich Cecelie, RN Clinical Measurements: Ability to maintain clinical measurements within normal limits will improve 01/28/2017 1142 - Progressing by Tomie ChinaJackson, Amyla Heffner Cecelie, RN Will remain free from infection 01/28/2017 1142 - Progressing by Tomie ChinaJackson, Earl Losee Cecelie, RN Diagnostic test results will improve 01/28/2017 1142 - Progressing by Tomie ChinaJackson, Zakariah Urwin Cecelie, RN Respiratory complications will improve 01/28/2017 1142 - Progressing by Tomie ChinaJackson, Faryal Marxen Cecelie, RN Cardiovascular complication will be avoided 01/28/2017 1142 - Progressing by Tomie ChinaJackson, Eria Lozoya Cecelie, RN Activity: Risk for activity intolerance will decrease 01/28/2017 1142 - Progressing by Tomie ChinaJackson, Tanganika Barradas Cecelie, RN Nutrition: Adequate nutrition will be maintained 01/28/2017 1142 - Progressing by Tomie ChinaJackson, Seibert Keeter Cecelie, RN Coping: Level of anxiety will decrease 01/28/2017 1142 - Progressing by Tomie ChinaJackson, Linet Brash Cecelie, RN Elimination: Will not experience complications related to bowel motility 01/28/2017 1142 - Progressing by Tomie ChinaJackson, Mechele Kittleson Cecelie, RN Will not experience complications related to urinary retention 01/28/2017 1142 - Progressing by Tomie ChinaJackson, Doss Cybulski Cecelie, RN Pain Managment: General experience of comfort will improve 01/28/2017 1142 - Progressing by Tomie ChinaJackson, Dazhane Villagomez Cecelie, RN Safety: Ability to remain free from injury will improve 01/28/2017 1142 - Progressing by Tomie ChinaJackson, Sura Canul Cecelie, RN Skin Integrity: Risk for impaired skin integrity will decrease 01/28/2017 1142 - Progressing by Tomie ChinaJackson, Chaske Paskett Cecelie, RN

## 2017-01-28 NOTE — Progress Notes (Signed)
Patient is discharge home in a stable condition, summary and f/u care given, verbalized understanding . 

## 2017-01-28 NOTE — Progress Notes (Signed)
SATURATION QUALIFICATIONS: (This note is used to comply with regulatory documentation for home oxygen)  Patient Saturations on Room Air at Rest = 95%  Patient Saturations on Room Air while Ambulating = 94%   

## 2017-01-28 NOTE — Progress Notes (Signed)
[facility]         [place]   01/28/2017  Patient: Robert Vance   Date of Birth:  04-30-1975  Date of admission:  01/26/2017  Date of Discharge  01/28/2017    To Whom it May Concern:   Robert Vance  may return to work on 01/29/17.  PHYSICAL ACTIVITY:  Full  If you have any questions or concerns, please don't hesitate to call.  Sincerely,   Altamese DillingVaibhavkumar Alta Shober M.D Pager [phone]- [phone] Office : [phone]   .

## 2017-01-28 NOTE — Progress Notes (Signed)
SATURATION QUALIFICATIONS: (This note is used to comply with regulatory documentation for home oxygen)  Patient Saturations on Room Air at Rest = 89%  Patient Saturations on Room Air while Ambulating = 88%  Patient Saturations on 2Liters of oxygen while Ambulating = 93%  Please briefly explain why patient needs home oxygen: 

## 2017-04-28 ENCOUNTER — Emergency Department: Payer: BLUE CROSS/BLUE SHIELD

## 2017-04-28 ENCOUNTER — Inpatient Hospital Stay
Admission: EM | Admit: 2017-04-28 | Discharge: 2017-04-30 | DRG: 193 | Disposition: A | Discharge status: Home / Self Care | Payer: BLUE CROSS/BLUE SHIELD | Attending: Internal Medicine | Admitting: Internal Medicine

## 2017-04-28 ENCOUNTER — Other Ambulatory Visit: Payer: Self-pay

## 2017-04-28 ENCOUNTER — Encounter: Payer: Self-pay | Admitting: Emergency Medicine

## 2017-04-28 DIAGNOSIS — R0902 Hypoxemia: Secondary | ICD-10-CM | POA: Diagnosis present

## 2017-04-28 DIAGNOSIS — N179 Acute kidney failure, unspecified: Secondary | ICD-10-CM | POA: Diagnosis present

## 2017-04-28 DIAGNOSIS — J189 Pneumonia, unspecified organism: Secondary | ICD-10-CM | POA: Diagnosis present

## 2017-04-28 DIAGNOSIS — J188 Other pneumonia, unspecified organism: Secondary | ICD-10-CM

## 2017-04-28 DIAGNOSIS — Z825 Family history of asthma and other chronic lower respiratory diseases: Secondary | ICD-10-CM

## 2017-04-28 DIAGNOSIS — E860 Dehydration: Secondary | ICD-10-CM | POA: Diagnosis present

## 2017-04-28 DIAGNOSIS — J45901 Unspecified asthma with (acute) exacerbation: Secondary | ICD-10-CM | POA: Diagnosis present

## 2017-04-28 DIAGNOSIS — J9600 Acute respiratory failure, unspecified whether with hypoxia or hypercapnia: Secondary | ICD-10-CM | POA: Diagnosis present

## 2017-04-28 DIAGNOSIS — A419 Sepsis, unspecified organism: Secondary | ICD-10-CM

## 2017-04-28 DIAGNOSIS — J9601 Acute respiratory failure with hypoxia: Secondary | ICD-10-CM | POA: Diagnosis present

## 2017-04-28 LAB — URINALYSIS, ROUTINE W REFLEX MICROSCOPIC
BACTERIA UA: NONE SEEN
Bilirubin Urine: NEGATIVE
Glucose, UA: 150 mg/dL — AB
Hgb urine dipstick: NEGATIVE
Ketones, ur: NEGATIVE mg/dL
Leukocytes, UA: NEGATIVE
NITRITE: NEGATIVE
PH: 5 (ref 5.0–8.0)
Protein, ur: NEGATIVE mg/dL
RBC / HPF: NONE SEEN RBC/hpf (ref 0–5)
SPECIFIC GRAVITY, URINE: 1.003 — AB (ref 1.005–1.030)
Squamous Epithelial / LPF: NONE SEEN

## 2017-04-28 LAB — CBC
HEMATOCRIT: 40.8 % (ref 40.0–52.0)
HEMOGLOBIN: 13.3 g/dL (ref 13.0–18.0)
MCH: 28.3 pg (ref 26.0–34.0)
MCHC: 32.6 g/dL (ref 32.0–36.0)
MCV: 86.7 fL (ref 80.0–100.0)
Platelets: 298 10*3/uL (ref 150–440)
RBC: 4.71 MIL/uL (ref 4.40–5.90)
RDW: 14.4 % (ref 11.5–14.5)
WBC: 9.9 10*3/uL (ref 3.8–10.6)

## 2017-04-28 LAB — BASIC METABOLIC PANEL
ANION GAP: 9 (ref 5–15)
BUN: 16 mg/dL (ref 6–20)
CHLORIDE: 102 mmol/L (ref 101–111)
CO2: 27 mmol/L (ref 22–32)
Calcium: 9.4 mg/dL (ref 8.9–10.3)
Creatinine, Ser: 1.32 mg/dL — ABNORMAL HIGH (ref 0.61–1.24)
GFR calc non Af Amer: >60 mL/min (ref >60)
Glucose, Bld: 114 mg/dL — ABNORMAL HIGH (ref 65–99)
Potassium: 4.1 mmol/L (ref 3.5–5.1)
SODIUM: 138 mmol/L (ref 135–145)

## 2017-04-28 LAB — BLOOD GAS, VENOUS
Acid-base deficit: 0.2 mmol/L (ref 0.0–2.0)
BICARBONATE: 24 mmol/L (ref 20.0–28.0)
O2 Saturation: 85.8 %
PCO2 VEN: 37 mmHg — AB (ref 44.0–60.0)
PO2 VEN: 50 mmHg — AB (ref 32.0–45.0)
Patient temperature: 37
pH, Ven: 7.42 (ref 7.250–7.430)

## 2017-04-28 LAB — INFLUENZA PANEL BY PCR (TYPE A & B)
INFLAPCR: NEGATIVE
INFLBPCR: NEGATIVE

## 2017-04-28 LAB — LACTIC ACID, PLASMA
LACTIC ACID, VENOUS: 3.6 mmol/L — AB (ref 0.5–1.9)
LACTIC ACID, VENOUS: 2.1 mmol/L — AB (ref 0.5–1.9)
Lactic Acid, Venous: 3.9 mmol/L (ref 0.5–1.9)

## 2017-04-28 MED ORDER — ORAL CARE MOUTH RINSE
15.0000 mL | Freq: Two times a day (BID) | OROMUCOSAL | Status: DC
  Administered 2017-04-28 – 2017-04-29 (×2): 15 mL via OROMUCOSAL

## 2017-04-28 MED ORDER — METHYLPREDNISOLONE SODIUM SUCC 125 MG IJ SOLR
125.0000 mg | Freq: Once | INTRAMUSCULAR | Status: AC
  Administered 2017-04-28: 125 mg via INTRAVENOUS

## 2017-04-28 MED ORDER — SODIUM CHLORIDE 0.9 % IV BOLUS
1000.0000 mL | Freq: Once | INTRAVENOUS | Status: AC
  Administered 2017-04-28: 1000 mL via INTRAVENOUS

## 2017-04-28 MED ORDER — METHYLPREDNISOLONE SODIUM SUCC 125 MG IJ SOLR
INTRAMUSCULAR | Status: AC
  Administered 2017-04-28: 125 mg via INTRAVENOUS
  Filled 2017-04-28: qty 2

## 2017-04-28 MED ORDER — ACETAMINOPHEN 650 MG RE SUPP
650.0000 mg | Freq: Four times a day (QID) | RECTAL | Status: DC | PRN
Start: 2017-04-28 — End: 2017-04-30

## 2017-04-28 MED ORDER — ONDANSETRON HCL 4 MG/2ML IJ SOLN: 4.0000 mg | Freq: Four times a day (QID) | INTRAMUSCULAR | Status: DC | PRN

## 2017-04-28 MED ORDER — GUAIFENESIN-DM 100-10 MG/5ML PO SYRP
10.0000 mL | ORAL_SOLUTION | ORAL | Status: DC | PRN
  Administered 2017-04-29 (×2): 10 mL via ORAL
  Filled 2017-04-28 (×3): qty 10

## 2017-04-28 MED ORDER — IPRATROPIUM-ALBUTEROL 0.5-2.5 (3) MG/3ML IN SOLN
3.0000 mL | Freq: Once | RESPIRATORY_TRACT | Status: AC
  Administered 2017-04-28: 3 mL via RESPIRATORY_TRACT

## 2017-04-28 MED ORDER — PREDNISONE 20 MG PO TABS
60.0000 mg | ORAL_TABLET | Freq: Every day | ORAL | Status: DC
  Administered 2017-04-28 – 2017-04-29 (×2): 60 mg via ORAL
  Filled 2017-04-28 (×2): qty 3

## 2017-04-28 MED ORDER — POLYETHYLENE GLYCOL 3350 17 G PO PACK: 17.0000 g | PACK | Freq: Every day | ORAL | Status: DC | PRN

## 2017-04-28 MED ORDER — ALBUTEROL SULFATE HFA 108 (90 BASE) MCG/ACT IN AERS: 2.0000 | INHALATION_SPRAY | Freq: Four times a day (QID) | RESPIRATORY_TRACT | Status: DC | PRN

## 2017-04-28 MED ORDER — ONDANSETRON HCL 4 MG PO TABS: 4.0000 mg | ORAL_TABLET | Freq: Four times a day (QID) | ORAL | Status: DC | PRN

## 2017-04-28 MED ORDER — LEVOFLOXACIN IN D5W 750 MG/150ML IV SOLN
750.0000 mg | INTRAVENOUS | Status: DC
  Administered 2017-04-29: 750 mg via INTRAVENOUS
  Filled 2017-04-28 (×2): qty 150

## 2017-04-28 MED ORDER — ALBUTEROL SULFATE (2.5 MG/3ML) 0.083% IN NEBU
2.5000 mg | INHALATION_SOLUTION | Freq: Four times a day (QID) | RESPIRATORY_TRACT | Status: DC
  Administered 2017-04-28 – 2017-04-30 (×7): 2.5 mg via RESPIRATORY_TRACT
  Filled 2017-04-28 (×6): qty 3

## 2017-04-28 MED ORDER — LORATADINE 10 MG PO TABS
10.0000 mg | ORAL_TABLET | Freq: Every day | ORAL | Status: DC
  Administered 2017-04-28 – 2017-04-30 (×3): 10 mg via ORAL
  Filled 2017-04-28 (×3): qty 1

## 2017-04-28 MED ORDER — IBUPROFEN 600 MG PO TABS
600.0000 mg | ORAL_TABLET | Freq: Once | ORAL | Status: AC
  Administered 2017-04-28: 600 mg via ORAL

## 2017-04-28 MED ORDER — FLUTICASONE FUROATE-VILANTEROL 200-25 MCG/INH IN AEPB
1.0000 | INHALATION_SPRAY | Freq: Every day | RESPIRATORY_TRACT | Status: DC
  Administered 2017-04-28 – 2017-04-30 (×3): 1 via RESPIRATORY_TRACT
  Filled 2017-04-28: qty 28

## 2017-04-28 MED ORDER — LEVOFLOXACIN IN D5W 750 MG/150ML IV SOLN
750.0000 mg | Freq: Once | INTRAVENOUS | Status: AC
  Administered 2017-04-28: 750 mg via INTRAVENOUS
  Filled 2017-04-28: qty 150

## 2017-04-28 MED ORDER — ALBUTEROL SULFATE (2.5 MG/3ML) 0.083% IN NEBU
INHALATION_SOLUTION | RESPIRATORY_TRACT | Status: AC
  Filled 2017-04-28: qty 6

## 2017-04-28 MED ORDER — IPRATROPIUM-ALBUTEROL 0.5-2.5 (3) MG/3ML IN SOLN
RESPIRATORY_TRACT | Status: AC
  Administered 2017-04-28: 3 mL via RESPIRATORY_TRACT
  Filled 2017-04-28: qty 9

## 2017-04-28 MED ORDER — ACETAMINOPHEN 325 MG PO TABS: 650.0000 mg | ORAL_TABLET | Freq: Four times a day (QID) | ORAL | Status: DC | PRN

## 2017-04-28 MED ORDER — IBUPROFEN 600 MG PO TABS
ORAL_TABLET | ORAL | Status: AC
  Filled 2017-04-28: qty 1

## 2017-04-28 MED ORDER — ENOXAPARIN SODIUM 40 MG/0.4ML ~~LOC~~ SOLN
40.0000 mg | SUBCUTANEOUS | Status: DC
  Filled 2017-04-28 (×2): qty 0.4

## 2017-04-28 MED ORDER — SODIUM CHLORIDE 0.9 % IV SOLN
Freq: Once | INTRAVENOUS | Status: AC
  Administered 2017-04-28: 16:00:00 via INTRAVENOUS

## 2017-04-28 MED ORDER — ALBUTEROL (5 MG/ML) CONTINUOUS INHALATION SOLN
10.0000 mg/h | INHALATION_SOLUTION | Freq: Once | RESPIRATORY_TRACT | Status: AC
  Administered 2017-04-28: 10 mg/h via RESPIRATORY_TRACT
  Filled 2017-04-28: qty 20

## 2017-04-28 MED ORDER — IBUPROFEN 400 MG PO TABS: 400.0000 mg | ORAL_TABLET | Freq: Four times a day (QID) | ORAL | Status: DC | PRN

## 2017-04-28 NOTE — ED Triage Notes (Addendum)
Patient ambulatory to triage with steady gait, with audible wheezing; pt reports wheezing today unrelieved by inhaler with recent cold symptoms; pt taken to room 14 via w/c by EDT Beth; report called to Schuyler Lake[place], [place]

## 2017-04-28 NOTE — ED Provider Notes (Addendum)
This patient was signed out to me by Dr. Bayard Malesandolph Brown.  42 year old male with known moderate asthma who has been out of his medications presenting for shortness of breath and wheezing.  The patient has not been having any fever or productive cough.  On arrival to the emergency department, the patient was hypoxic to 88% on room air, which improved with supplemental oxygen at 4 L nasal cannula.  On my examination he was 96% on 4 L nasal cannula so I have been weaning his oxygen down to 2 L and will reevaluate him.  He continues to have significant expiratory greater than inspiratory wheezing on my exam and continuous nebulizer treatment has been ordered.  He has already received steroids.  His chest x-ray is a portable view that is concerning for atelectasis versus infiltrate.  I have ordered a PA and lateral for reevaluation as I do not want under or over treat pneumonia in this patient.  Plan reevaluation for final disposition.  ----------------------------------------- 9:33 AM on 04/28/2017 -----------------------------------------  The patient does have what appears to be multifocal pneumonia on his chest x-ray.  He is not tolerating 2 L nasal cannula and has been bumped up to 4 L nasal cannula at this time.  He is persistently tachycardic.  I have initiated sepsis protocol with immediate to biotics.  The patient is very resistant to admission I have had a long discussion with him about the risks of pneumonia, especially the risks of pneumonia in the setting of hypoxia and underlying chronic lung disease.  He understands that he could die, or risk significant morbidity, if he leaves AGAINST MEDICAL ADVICE.  He would like some time to decide.  ED ECG REPORT I, Rockne MenghiniNorman, Anne-Caroline, the attending physician, personally viewed and interpreted this ECG.   Date: 04/28/2017  EKG Time: 946  Rate: 111  Rhythm: sinus tachycardia  Axis: normal  Intervals:none  ST&T Change: No STEMI   CRITICAL  CARE Performed by: Rockne MenghiniNorman, Anne-Caroline   Total critical care time: 45 minutes  Critical care time was exclusive of separately billable procedures and treating other patients.  Critical care was necessary to treat or prevent imminent or life-threatening deterioration.  Critical care was time spent personally by me on the following activities: development of treatment plan with patient and/or surrogate as well as nursing, discussions with consultants, evaluation of patient's response to treatment, examination of patient, obtaining history from patient or surrogate, ordering and performing treatments and interventions, ordering and review of laboratory studies, ordering and review of radiographic studies, pulse oximetry and re-evaluation of patient's condition.    Rockne MenghiniNorman, Anne-Caroline, MD 04/28/17 [id number]    Rockne MenghiniNorman, Anne-Caroline, MD 04/28/17 [id number]    Rockne MenghiniNorman, Anne-Caroline, MD 04/28/17 [id number]    Rockne MenghiniNorman, Anne-Caroline, MD 04/28/17 1105

## 2017-04-28 NOTE — ED Notes (Signed)
ED Provider at bedside. 

## 2017-04-28 NOTE — ED Provider Notes (Signed)
[facility] Emergency Department Provider Note   First MD Initiated Contact with Patient 04/28/17 [phone]     (approximate)  I have reviewed the triage vital signs and the nursing notes.   HISTORY  Chief Complaint Asthma    HPI Robert Vance is a 42 y.o. male with history of asthma presents to the emergency department acute onset of difficulty breathing and wheezing which began this morning.  Patient states that symptoms started after work.  Patient states that he is currently taking Brio however he ran out 2 days ago.   Past Medical History:  Diagnosis Date  . Asthma     Patient Active Problem List   Diagnosis Date Noted  . Asthma exacerbation 01/26/2017  . Hypoxia 09/14/2016    Past Surgical History:  Procedure Laterality Date  . none      Prior to Admission medications   Medication Sig Start Date End Date Taking? Authorizing Provider  albuterol (PROVENTIL HFA;VENTOLIN HFA) 108 (90 Base) MCG/ACT inhaler Inhale 2 puffs into the lungs every 6 (six) hours as needed for wheezing or shortness of breath. 07/25/16   Enid Derry, PA-C  metFORMIN (GLUCOPHAGE) 500 MG tablet Take 1 tablet by mouth 2 (two) times daily. 01/12/17   [provider]    Allergies Shellfish allergy  Family History  Problem Relation Age of Onset  . COPD Father   . CAD Neg Hx   . Diabetes Mellitus II Neg Hx   . Hypertension Neg Hx     Social History Social History   Tobacco Use  . Smoking status: Never Smoker  . Smokeless tobacco: Never Used  Substance Use Topics  . Alcohol use: No  . Drug use: No    Review of Systems Constitutional: No fever/chills Eyes: No visual changes. ENT: No sore throat. Cardiovascular: Denies chest pain. Respiratory: Positive for dyspnea and cough Gastrointestinal: No abdominal pain.  No nausea, no vomiting.  No diarrhea.  No constipation. Genitourinary: Negative for dysuria. Musculoskeletal: Negative for neck pain.   Negative for back pain. Integumentary: Negative for rash. Neurological: Negative for headaches, focal weakness or numbness.   ____________________________________________   PHYSICAL EXAM:  VITAL SIGNS: ED Triage Vitals  Enc Vitals Group     BP 04/28/17 0631 127/69     Pulse Rate 04/28/17 0631 (!) 112     Resp 04/28/17 0631 (!) 22     Temp 04/28/17 0631 98.8 F (37.1 C)     Temp Source 04/28/17 0631 Oral     SpO2 04/28/17 0631 (!) 89 %     Weight 04/28/17 0628 80.7 kg (178 lb)     Height 04/28/17 0628 1.702 m (5\' 7" )     Head Circumference --      Peak Flow --      Pain Score --      Pain Loc --      Pain Edu? --      Excl. in GC? --     Constitutional: Alert and oriented. Well appearing and in no acute distress. Eyes: Conjunctivae are normal.  Head: Atraumatic. Mouth/Throat: Mucous membranes are moist. Oropharynx non-erythematous. Neck: No stridor.   Cardiovascular: Normal rate, regular rhythm. Good peripheral circulation. Grossly normal heart sounds. Respiratory: Tachypnea, positive accessory respiratory muscle use.  Diffuse aspiratory and expiratory wheezing. Gastrointestinal: Soft and nontender. No distention.  Musculoskeletal: No lower extremity tenderness nor edema. No gross deformities of extremities. Neurologic:  Normal speech and language. No gross focal neurologic deficits are appreciated.  Skin:  Skin is warm, dry and intact. No rash noted. Psychiatric: Mood and affect are normal. Speech and behavior are normal.    RADIOLOGY I, [place] N Cera Rorke, personally viewed and evaluated these images (plain radiographs) as part of my medical decision making, as well as reviewing the written report by the radiologist.     Official radiology report(s): Dg Chest 1 View  Result Date: 04/28/2017 CLINICAL DATA:  Wheezing.  History of asthma. EXAM: CHEST  1 VIEW COMPARISON:  01/26/2017; 04/01/2016 FINDINGS: Grossly unchanged cardiac silhouette and mediastinal contours  given slightly reduced lung volumes. Interval development of right infrahilar heterogeneous potential airspace opacities. No pleural effusion or pneumothorax. No evidence of edema. No acute osseus abnormalities. IMPRESSION: Right infrahilar heterogeneous opacities, atelectasis versus infiltrate. Further evaluation with a PA and lateral chest radiograph may be obtained as clinically indicated. Electronically Signed   By: Simonne ComeJohn  Watts M.D.   On: 04/28/2017 07:24     .Critical Care Performed by: Darci CurrentBrown, Hamilton N, MD Authorized by: Darci CurrentBrown, Ambler N, MD   Critical care provider statement:    Critical care time (minutes):  30   Critical care time was exclusive of:  Separately billable procedures and treating other patients and teaching time   Critical care was necessary to treat or prevent imminent or life-threatening deterioration of the following conditions:  Respiratory failure   Critical care was time spent personally by me on the following activities:  Development of treatment plan with patient or surrogate, discussions with consultants, evaluation of patient's response to treatment, examination of patient, obtaining history from patient or surrogate, ordering and performing treatments and interventions, ordering and review of laboratory studies, ordering and review of radiographic studies, pulse oximetry, re-evaluation of patient's condition and review of old charts   I assumed direction of critical care for this patient from another provider in my specialty: no       ____________________________________________   INITIAL IMPRESSION / ASSESSMENT AND PLAN / ED COURSE  As part of my medical decision making, I reviewed the following data within the electronic MEDICAL RECORD NUMBER   42 year old male presenting the emergency department above history and physical exam consistent with acute asthma exacerbation with apparent rest tori difficulty.  As such patient was given 3 Duonebs as well as 125 mg  of IV Solu-Medrol.  On reevaluation patient no longer tachypneic however while wheezing has improved she still has expiratory wheezing.  Patient transferred to Dr. Sharma CovertNorman with plan for reevaluation possible discharge if appropriate.  ____________________________________________  FINAL CLINICAL IMPRESSION(S) / ED DIAGNOSES  Final diagnoses:  Severe asthma with exacerbation, unspecified whether persistent     MEDICATIONS GIVEN DURING THIS VISIT:  Medications  albuterol (PROVENTIL,VENTOLIN) solution continuous neb (has no administration in time range)  albuterol (PROVENTIL) (2.5 MG/3ML) 0.083% nebulizer solution (has no administration in time range)  methylPREDNISolone sodium succinate (SOLU-MEDROL) 125 mg/2 mL injection 125 mg (125 mg Intravenous Given 04/28/17 0641)  ipratropium-albuterol (DUONEB) 0.5-2.5 (3) MG/3ML nebulizer solution 3 mL (3 mLs Nebulization Given 04/28/17 0641)  ipratropium-albuterol (DUONEB) 0.5-2.5 (3) MG/3ML nebulizer solution 3 mL (3 mLs Nebulization Given 04/28/17 0641)  ipratropium-albuterol (DUONEB) 0.5-2.5 (3) MG/3ML nebulizer solution 3 mL (3 mLs Nebulization Given 04/28/17 0640)  ibuprofen (ADVIL,MOTRIN) tablet 600 mg (600 mg Oral Given 04/28/17 0700)     ED Discharge Orders    None       Note:  This document was prepared using Dragon voice recognition software and may include unintentional dictation errors.  Darci Current, MD 04/28/17 [phone]

## 2017-04-28 NOTE — ED Notes (Signed)
CODE SEPSIS 

## 2017-04-28 NOTE — ED Notes (Signed)
Pt placed on 4L O2, pt's oxygen level 94%. EDP aware.

## 2017-04-28 NOTE — ED Notes (Signed)
Dr Sharma CovertNorman notified in person LA 3.6. No new orders.

## 2017-04-28 NOTE — H&P (Signed)
[facility] - [place] at [facility]   PATIENT NAME: Robert Vance    MR#:  [id number]  DATE OF BIRTH:  [birth year]  DATE OF ADMISSION:  04/28/2017  PRIMARY CARE PHYSICIAN: [place] [place] [facility]   REQUESTING/REFERRING PHYSICIAN: Dr. Sharma Covert  CHIEF COMPLAINT:  increasing shortness of breath cough and not feeling well  HISTORY OF PRESENT ILLNESS:  Robert Vance  is a 42 y.o. male with a known history of asthma comes to the emergency room with not feeling well shortness of breath along with wheezing. He was found to be tachycardic hypoxic with sats 88 to 89% on room air. Currently he is on 4 L and sat around 94 to 96%. Patient in the evaluation was found to have pneumonia and is being admitted for further evaluation of management. He received a dose of Levaquin and IV Solu-Medrol along with breathing treatment.  PAST MEDICAL HISTORY:   Past Medical History:  Diagnosis Date  . Asthma     PAST SURGICAL HISTOIRY:   Past Surgical History:  Procedure Laterality Date  . none      SOCIAL HISTORY:   Social History   Tobacco Use  . Smoking status: Never Smoker  . Smokeless tobacco: Never Used  Substance Use Topics  . Alcohol use: No    FAMILY HISTORY:   Family History  Problem Relation Age of Onset  . COPD Father   . CAD Neg Hx   . Diabetes Mellitus II Neg Hx   . Hypertension Neg Hx     DRUG ALLERGIES:   Allergies  Allergen Reactions  . Shellfish Allergy     REVIEW OF SYSTEMS:  Review of Systems  Constitutional: Positive for malaise/fatigue. Negative for chills, fever and weight loss.  HENT: Negative for ear discharge, ear pain and nosebleeds.   Eyes: Negative for blurred vision, pain and discharge.  Respiratory: Positive for cough, shortness of breath and wheezing. Negative for sputum production and stridor.   Cardiovascular: Negative for chest pain, palpitations, orthopnea and PND.  Gastrointestinal: Negative for abdominal  pain, diarrhea, nausea and vomiting.  Genitourinary: Negative for frequency and urgency.  Musculoskeletal: Negative for back pain and joint pain.  Neurological: Negative for sensory change, speech change, focal weakness and weakness.  Psychiatric/Behavioral: Negative for depression and hallucinations. The patient is not nervous/anxious.      MEDICATIONS AT HOME:   Prior to Admission medications   Medication Sig Start Date End Date Taking? Authorizing Provider  albuterol (PROVENTIL HFA;VENTOLIN HFA) 108 (90 Base) MCG/ACT inhaler Inhale 2 puffs into the lungs every 6 (six) hours as needed for wheezing or shortness of breath. 07/25/16  Yes Enid Derry, PA-C  fluticasone furoate-vilanterol (BREO ELLIPTA) 200-25 MCG/INH AEPB Inhale 1 puff into the lungs daily. 03/07/17  Yes [provider]      VITAL SIGNS:  Blood pressure 138/82, pulse (!) 103, temperature 98.8 F (37.1 C), temperature source Oral, resp. rate 17, height 5\' 7"  (1.702 m), weight 80.7 kg (178 lb), SpO2 93 %.  PHYSICAL EXAMINATION:  GENERAL:  42 y.o.-year-old patient lying in the bed with no acute distress.  EYES: Pupils equal, round, reactive to light and accommodation. No scleral icterus. Extraocular muscles intact.  HEENT: Head atraumatic, normocephalic. Oropharynx and nasopharynx clear.  NECK:  Supple, no jugular venous distention. No thyroid enlargement, no tenderness.  LUNGS: Normal breath sounds bilaterally, no wheezing, rales,rhonchi or crepitation. No use of accessory muscles of respiration.  CARDIOVASCULAR: S1, S2 normal. No murmurs, rubs, or gallops.tachycardia  ABDOMEN: Soft, nontender, nondistended. Bowel sounds present. No organomegaly or mass.  EXTREMITIES: No pedal edema, cyanosis, or clubbing.  NEUROLOGIC: Cranial nerves II through XII are intact. Muscle strength 5/5 in all extremities. Sensation intact. Gait not checked.  PSYCHIATRIC: The patient is alert and oriented x 3.  SKIN: No obvious rash,  lesion, or ulcer.   LABORATORY PANEL:   CBC Recent Labs  Lab 04/28/17 0641  WBC 9.9  HGB 13.3  HCT 40.8  PLT 298   ------------------------------------------------------------------------------------------------------------------  Chemistries  Recent Labs  Lab 04/28/17 0641  NA 138  K 4.1  CL 102  CO2 27  GLUCOSE 114*  BUN 16  CREATININE 1.32*  CALCIUM 9.4   ------------------------------------------------------------------------------------------------------------------  Cardiac Enzymes No results for input(s): TROPONINI in the last 168 hours. ------------------------------------------------------------------------------------------------------------------  RADIOLOGY:  Dg Chest 1 View  Result Date: 04/28/2017 CLINICAL DATA:  Wheezing.  History of asthma. EXAM: CHEST  1 VIEW COMPARISON:  01/26/2017; 04/01/2016 FINDINGS: Grossly unchanged cardiac silhouette and mediastinal contours given slightly reduced lung volumes. Interval development of right infrahilar heterogeneous potential airspace opacities. No pleural effusion or pneumothorax. No evidence of edema. No acute osseus abnormalities. IMPRESSION: Right infrahilar heterogeneous opacities, atelectasis versus infiltrate. Further evaluation with a PA and lateral chest radiograph may be obtained as clinically indicated. Electronically Signed   By: Simonne ComeJohn  Watts M.D.   On: 04/28/2017 07:24   Dg Chest 2 View  Result Date: 04/28/2017 CLINICAL DATA:  Evaluate for atelectasis versus infiltrate. EXAM: CHEST - 2 VIEW COMPARISON:  Earlier today FINDINGS: Right infrahilar airspace opacity is again noted concerning for infiltrate/pneumonia. Patchy opacities now present in the left perihilar and infrahilar regions. Heart is normal size. No effusions. No acute bony abnormality. IMPRESSION: Patchy bilateral airspace opacities concerning for multifocal pneumonia. Electronically Signed   By: Charlett NoseKevin  Dover M.D.   On: 04/28/2017 09:10    EKG:    Tachycardia on telemetry IMPRESSION AND PLAN:   Robert Vance  is a 42 y.o. male with a known history of asthma comes to the emergency room with not feeling well shortness of breath along with wheezing. He was found to be tachycardic hypoxic with sats 88 to 89% on room air. Currently he is on 4 L and sat around 94 to 96%. Patient in the evaluation was found to have pneumonia  1. multifocal pneumonia -admit to medical floor -IV Levaquin, prednisone, inhalers, nebulizer, flutter valve -follow blood cultures  2. Acute hypoxic respiratory failure secondary to asthma flare in the setting of multifocal pneumonia -treatment as above -pulmonary consultation if no improvement. Patient will benefit from outpatient follow up with pulmonary given his allergies and history of asthma  3. DVT prophylaxis subcu Lovenox  4. Tachycardia due to number one    All the records are reviewed and case discussed with ED provider. Management plans discussed with the patient, family and they are in agreement.  CODE STATUS: full  TOTAL TIME TAKING CARE OF THIS PATIENT: 50 minutes.    Enedina FinnerSona Dalicia Kisner M.D on 04/28/2017 at 11:59 AM  Between 7am to 6pm - Pager - [phone]  After 6pm go to [email or web address] - password [email or web address] [facility]  [facility]  Office  [phone]  CC: Primary care physician; [place]

## 2017-04-28 NOTE — ED Notes (Signed)
Date and time results received: 04/28/17 1233  Test: Lactic Acid Critical Value: 3.9  Name of Provider Notified: Dr. Allena KatzPatel  Orders Received? Or Actions Taken?: Redraw lactic acid in 6 hours. Continue to monitor

## 2017-04-28 NOTE — Progress Notes (Signed)
CODE SEPSIS - PHARMACY COMMUNICATION  **Broad Spectrum Antibiotics should be administered within 1 hour of Sepsis diagnosis**  Time Code Sepsis Called/Page Received: 09:36  Antibiotics Ordered: Levofloxacin   Time of 1st antibiotic administration: 10:27  Additional action taken by pharmacy: N/A   Cleopatra CedarStephanie Haidyn Kilburg ,PharmD Pharmacy Resident  04/28/2017  9:39 AM

## 2017-04-29 LAB — BASIC METABOLIC PANEL
Anion gap: 7 (ref 5–15)
BUN: 22 mg/dL — ABNORMAL HIGH (ref 6–20)
CHLORIDE: 106 mmol/L (ref 101–111)
CO2: 25 mmol/L (ref 22–32)
Calcium: 8.9 mg/dL (ref 8.9–10.3)
Creatinine, Ser: 1.17 mg/dL (ref 0.61–1.24)
GFR calc non Af Amer: >60 mL/min (ref >60)
Glucose, Bld: 117 mg/dL — ABNORMAL HIGH (ref 65–99)
POTASSIUM: 4.5 mmol/L (ref 3.5–5.1)
Sodium: 138 mmol/L (ref 135–145)

## 2017-04-29 LAB — LACTIC ACID, PLASMA: Lactic Acid, Venous: 0.9 mmol/L (ref 0.5–1.9)

## 2017-04-29 MED ORDER — GUAIFENESIN 100 MG/5ML PO SOLN
10.0000 mL | ORAL | Status: DC | PRN
  Filled 2017-04-29: qty 10

## 2017-04-29 MED ORDER — GUAIFENESIN 200 MG PO TABS
200.0000 mg | ORAL_TABLET | ORAL | Status: DC | PRN
  Filled 2017-04-29: qty 1

## 2017-04-29 NOTE — Progress Notes (Addendum)
[facility] - [place] at [facility]   PATIENT NAME: Robert Vance    MR#:  [id number]  DATE OF BIRTH:  06/17/1975  SUBJECTIVE:  CHIEF COMPLAINT:   Chief Complaint  Patient presents with  . Asthma   Cough and mild wheezing.  On oxygen by nasal cannula 4 L this morning. REVIEW OF SYSTEMS:  Review of Systems  Constitutional: Negative for chills, fever and malaise/fatigue.  HENT: Negative for sore throat.   Eyes: Negative for blurred vision and double vision.  Respiratory: Positive for cough and wheezing. Negative for hemoptysis, shortness of breath and stridor.   Cardiovascular: Negative for chest pain, palpitations, orthopnea and leg swelling.  Gastrointestinal: Negative for abdominal pain, blood in stool, diarrhea, melena, nausea and vomiting.  Genitourinary: Negative for dysuria, flank pain and hematuria.  Musculoskeletal: Negative for back pain and joint pain.  Skin: Negative for rash.  Neurological: Negative for dizziness, sensory change, focal weakness, seizures, loss of consciousness, weakness and headaches.  Endo/Heme/Allergies: Negative for polydipsia.  Psychiatric/Behavioral: Negative for depression. The patient is not nervous/anxious.     DRUG ALLERGIES:   Allergies  Allergen Reactions  . Shellfish Allergy    VITALS:  Blood pressure 121/70, pulse 75, temperature 98 F (36.7 C), temperature source Oral, resp. rate 20, height 5\' 7"  (1.702 m), weight 178 lb (80.7 kg), SpO2 98 %. PHYSICAL EXAMINATION:  Physical Exam  Constitutional: He is oriented to person, place, and time.  HENT:  Head: Normocephalic.  Mouth/Throat: Oropharynx is clear and moist.  Eyes: Pupils are equal, round, and reactive to light. Conjunctivae and EOM are normal. No scleral icterus.  Neck: Normal range of motion. Neck supple. No JVD present. No tracheal deviation present.  Cardiovascular: Normal rate, regular rhythm, normal heart sounds and intact distal pulses. Exam  reveals no gallop.  No murmur heard. Pulmonary/Chest: Effort normal. No respiratory distress. He has wheezes. He has no rales.  Abdominal: Soft. Bowel sounds are normal. He exhibits no distension. There is no tenderness. There is no rebound.  Musculoskeletal: Normal range of motion. He exhibits no edema or tenderness.  Neurological: He is alert and oriented to person, place, and time. No cranial nerve deficit.  Skin: Skin is warm. No rash noted. No erythema.  Psychiatric: He has a normal mood and affect.   LABORATORY PANEL:  Male CBC Recent Labs  Lab 04/28/17 0641  WBC 9.9  HGB 13.3  HCT 40.8  PLT 298   ------------------------------------------------------------------------------------------------------------------ Chemistries  Recent Labs  Lab 04/29/17 0823  NA 138  K 4.5  CL 106  CO2 25  GLUCOSE 117*  BUN 22*  CREATININE 1.17  CALCIUM 8.9   RADIOLOGY:  No results found. ASSESSMENT AND PLAN:   Robert Vance  is a 42 y.o. male with a known history of asthma comes to the emergency room with not feeling well shortness of breath along with wheezing. He was found to be tachycardic hypoxic with sats 88 to 89% on room air. Currently he is on 4 L and sat around 94 to 96%. Patient in the evaluation was found to have pneumonia  1. multifocal pneumonia -IV Levaquin, prednisone, inhalers, nebulizer, flutter valve -follow blood cultures, Robitussin as needed.  2. Acute hypoxic respiratory failure secondary to asthma flare in the setting of multifocal pneumonia Try to wean off oxygen, nebulizer as needed.  3. DVT prophylaxis subcu Lovenox  4. Tachycardia due to #1.  Improved.  Acute renal failure due to dehydration.  Improved with IV fluid  support.  All the records are reviewed and case discussed with Care Management/Social Worker. Management plans discussed with the patient, family and they are in agreement.  CODE STATUS: Full Code  TOTAL TIME TAKING CARE OF THIS  PATIENT: 36 minutes.   More than 50% of the time was spent in counseling/coordination of care: YES  POSSIBLE D/C IN 2 DAYS, DEPENDING ON CLINICAL CONDITION.   Shaune Pollack M.D on 04/29/2017 at 1:54 PM  Between 7am to 6pm - Pager - [phone]  After 6pm go to [email or web address] - Therapist, [facility] Hospitalists

## 2017-04-30 MED ORDER — LEVOFLOXACIN 750 MG PO TABS: 750.0000 mg | ORAL_TABLET | Freq: Every day | ORAL | 0 refills | Status: DC

## 2017-04-30 MED ORDER — PREDNISONE 20 MG PO TABS
30.0000 mg | ORAL_TABLET | Freq: Every day | ORAL | Status: AC
  Administered 2017-04-30: 30 mg via ORAL
  Filled 2017-04-30: qty 2

## 2017-04-30 MED ORDER — ALBUTEROL SULFATE HFA 108 (90 BASE) MCG/ACT IN AERS: 2.0000 | INHALATION_SPRAY | Freq: Four times a day (QID) | RESPIRATORY_TRACT | 1 refills | Status: AC | PRN

## 2017-04-30 MED ORDER — GUAIFENESIN ER 600 MG PO TB12
600.0000 mg | ORAL_TABLET | Freq: Two times a day (BID) | ORAL | Status: DC | PRN
  Administered 2017-04-30: 600 mg via ORAL
  Filled 2017-04-30: qty 1

## 2017-04-30 MED ORDER — GUAIFENESIN ER 600 MG PO TB12: 600.0000 mg | ORAL_TABLET | Freq: Two times a day (BID) | ORAL | 0 refills | Status: DC | PRN

## 2017-04-30 MED ORDER — LEVOFLOXACIN 750 MG PO TABS
750.0000 mg | ORAL_TABLET | Freq: Every day | ORAL | Status: DC
  Administered 2017-04-30: 750 mg via ORAL
  Filled 2017-04-30: qty 1

## 2017-04-30 NOTE — Progress Notes (Signed)
[facility] - [place] at [facility]        Burna Fortslfonso Widen was admitted to the Hospital on 04/28/2017 and Discharged  04/30/2017 and should be excused from work/school   for 6  days starting 04/28/2017 , may return to work/school without any restrictions.  Shaune PollackQing Lestine Rahe M.D on 04/30/2017,at 10:33 AM  [facility] - [place] at [facility]    Office  [phone]

## 2017-04-30 NOTE — Discharge Summary (Signed)
[employer] - [place] at [facility]   PATIENT NAME: Robert Vance    MR#:  [id number]  DATE OF BIRTH:  December 20, 1975  DATE OF ADMISSION:  04/28/2017   ADMITTING PHYSICIAN: Enedina Finner, MD  DATE OF DISCHARGE: 04/30/2017 11:33 AM  PRIMARY CARE PHYSICIAN: [place], [facility]   ADMISSION DIAGNOSIS:  Hypoxia [R09.02] Sepsis, due to unspecified organism (HCC) [A41.9] Multifocal pneumonia [J18.9] Severe asthma with exacerbation, unspecified whether persistent [J45.901] DISCHARGE DIAGNOSIS:  Active Problems:   Acute respiratory failure (HCC)  SECONDARY DIAGNOSIS:   Past Medical History:  Diagnosis Date  . Asthma    HOSPITAL COURSE:    AlfonsoFreemanis a42 y.o.malewith a known history of asthma comes to the emergency room with not feeling well shortness of breath along with wheezing. He was found to be tachycardic hypoxic with sats 88 to 89% on room air. Currently he is on 4 L and sat around 94 to 96%. Patient in the evaluation was found to have pneumonia  1.multifocal pneumonia He has been treated with IV Levaquin, prednisone, inhalers, nebulizer, flutter valve Negative blood cultures so far, Robitussin as needed. Changed to p.o. Levaquin for 5 more days.  2.Acute hypoxic respiratory failure secondary to asthma flare in the setting of multifocal pneumonia Wean off oxygen today, nebulizer as needed.  3.DVT prophylaxis subcu Lovenox  4.Tachycardia due to #1.  Improved.  Acute renal failure due to dehydration.  Improved with IV fluid support. DISCHARGE CONDITIONS:  Stable, discharged to home today. CONSULTS OBTAINED:   DRUG ALLERGIES:   Allergies  Allergen Reactions  . Shellfish Allergy    DISCHARGE MEDICATIONS:   Allergies as of 04/30/2017      Reactions   Shellfish Allergy       Medication List    TAKE these medications   albuterol 108 (90 Base) MCG/ACT inhaler Commonly known as:  PROVENTIL HFA;VENTOLIN HFA Inhale 2  puffs into the lungs every 6 (six) hours as needed for wheezing or shortness of breath.   BREO ELLIPTA 200-25 MCG/INH Aepb Generic drug:  fluticasone furoate-vilanterol Inhale 1 puff into the lungs daily.   guaiFENesin 600 MG 12 hr tablet Commonly known as:  MUCINEX Take 1 tablet (600 mg total) by mouth 2 (two) times daily as needed for to loosen phlegm.   levofloxacin 750 MG tablet Commonly known as:  LEVAQUIN Take 1 tablet (750 mg total) by mouth daily.        DISCHARGE INSTRUCTIONS:  See AVS. If you experience worsening of your admission symptoms, develop shortness of breath, life threatening emergency, suicidal or homicidal thoughts you must seek medical attention immediately by calling 911 or calling your MD immediately  if symptoms less severe.  You Must read complete instructions/literature along with all the possible adverse reactions/side effects for all the Medicines you take and that have been prescribed to you. Take any new Medicines after you have completely understood and accpet all the possible adverse reactions/side effects.   Please note  You were cared for by a hospitalist during your hospital stay. If you have any questions about your discharge medications or the care you received while you were in the hospital after you are discharged, you can call the unit and asked to speak with the hospitalist on call if the hospitalist that took care of you is not available. Once you are discharged, your primary care physician will handle any further medical issues. Please note that NO REFILLS for any discharge medications will be authorized once you  are discharged, as it is imperative that you return to your primary care physician (or establish a relationship with a primary care physician if you do not have one) for your aftercare needs so that they can reassess your need for medications and monitor your lab values.    On the day of Discharge:  VITAL SIGNS:  Blood pressure  112/66, pulse 70, temperature 98.1 F (36.7 C), temperature source Oral, resp. rate 16, height 5\' 7"  (1.702 m), weight 178 lb (80.7 kg), SpO2 92 %. PHYSICAL EXAMINATION:  GENERAL:  42 y.o.-year-old patient lying in the bed with no acute distress.  EYES: Pupils equal, round, reactive to light and accommodation. No scleral icterus. Extraocular muscles intact.  HEENT: Head atraumatic, normocephalic. Oropharynx and nasopharynx clear.  NECK:  Supple, no jugular venous distention. No thyroid enlargement, no tenderness.  LUNGS: Normal breath sounds bilaterally, tiny wheezing on right side, no rales,rhonchi or crepitation. No use of accessory muscles of respiration.  CARDIOVASCULAR: S1, S2 normal. No murmurs, rubs, or gallops.  ABDOMEN: Soft, non-tender, non-distended. Bowel sounds present. No organomegaly or mass.  EXTREMITIES: No pedal edema, cyanosis, or clubbing.  NEUROLOGIC: Cranial nerves II through XII are intact. Muscle strength 5/5 in all extremities. Sensation intact. Gait not checked.  PSYCHIATRIC: The patient is alert and oriented x 3.  SKIN: No obvious rash, lesion, or ulcer.  DATA REVIEW:   CBC Recent Labs  Lab 04/28/17 0641  WBC 9.9  HGB 13.3  HCT 40.8  PLT 298    Chemistries  Recent Labs  Lab 04/29/17 0823  NA 138  K 4.5  CL 106  CO2 25  GLUCOSE 117*  BUN 22*  CREATININE 1.17  CALCIUM 8.9     Microbiology Results  Results for orders placed or performed during the hospital encounter of 04/28/17  Blood culture (routine x 2)     Status: None (Preliminary result)   Collection Time: 04/28/17 10:21 AM  Result Value Ref Range Status   Specimen Description   Final    BLOOD Blood Culture results may not be optimal due to an excessive volume of blood received in culture bottles   Special Requests BOTTLES DRAWN AEROBIC AND ANAEROBIC BLOOD LEFT ARM  Final   Culture   Final    NO GROWTH 2 DAYS Performed at [facility], [address] [place], [place]  [address]    Report Status PENDING  Incomplete  Blood culture (routine x 2)     Status: None (Preliminary result)   Collection Time: 04/28/17 10:22 AM  Result Value Ref Range Status   Specimen Description BLOOD BLOOD RIGHT WRIST  Final   Special Requests   Final    BOTTLES DRAWN AEROBIC AND ANAEROBIC Blood Culture adequate volume   Culture   Final    NO GROWTH 2 DAYS Performed at [facility], [address] [place], [place] [address]    Report Status PENDING  Incomplete    RADIOLOGY:  No results found.   Management plans discussed with the patient, family and they are in agreement.  CODE STATUS: Full Code   TOTAL TIME TAKING CARE OF THIS PATIENT: 33 minutes.    Shaune PollackQing Alexxia Stankiewicz M.D on 04/30/2017 at 12:29 PM  Between 7am to 6pm - Pager - [phone]  After 6pm go to [email or web address] - Social research officer, [email or web address] [email or web address]  [facility]  Office  [phone]  CC: Primary care physician; [place]   Note: This dictation was prepared with Dragon dictation  along with smaller phrase technology. Any transcriptional errors that result from this process are unintentional.

## 2017-05-03 LAB — CULTURE, BLOOD (ROUTINE X 2)
CULTURE: NO GROWTH
CULTURE: NO GROWTH
Special Requests: ADEQUATE

## 2017-05-05 ENCOUNTER — Telehealth: Payer: Self-pay

## 2017-05-05 NOTE — Telephone Encounter (Signed)
Flagged on EMMI report for not having a follow up scheduled and not reading discharge papers.  1st attempt to reach patient made 04/24/17 with a voicemail left encouraging callback.  2nd attempt to reach patient made 05/05/17.  Mailbox full so unable to leave message.

## 2018-06-23 IMAGING — CR DG CHEST 2V
2 series · 2 of 2 positions shown · non-contrast
Comparison: Earlier today

CLINICAL DATA: Evaluate for atelectasis versus infiltrate.

EXAM:
CHEST - 2 VIEW

[chest pa]
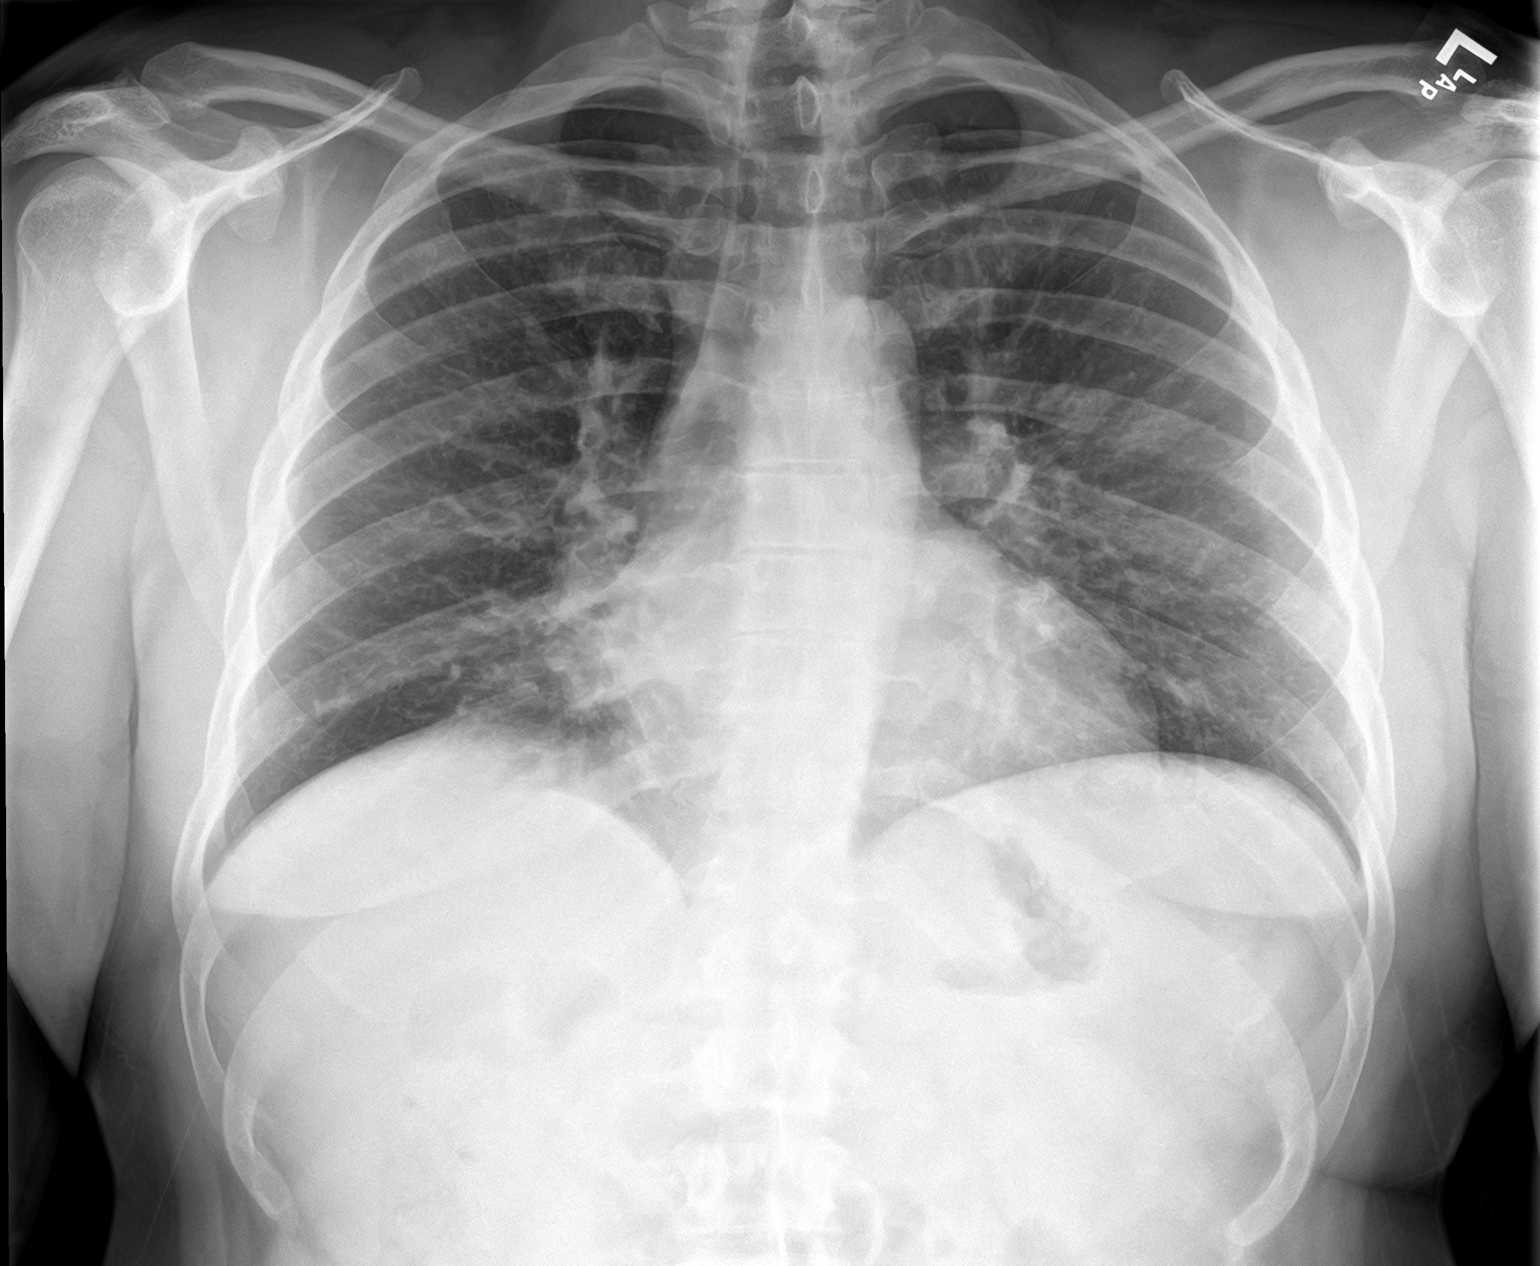

[chest lat]
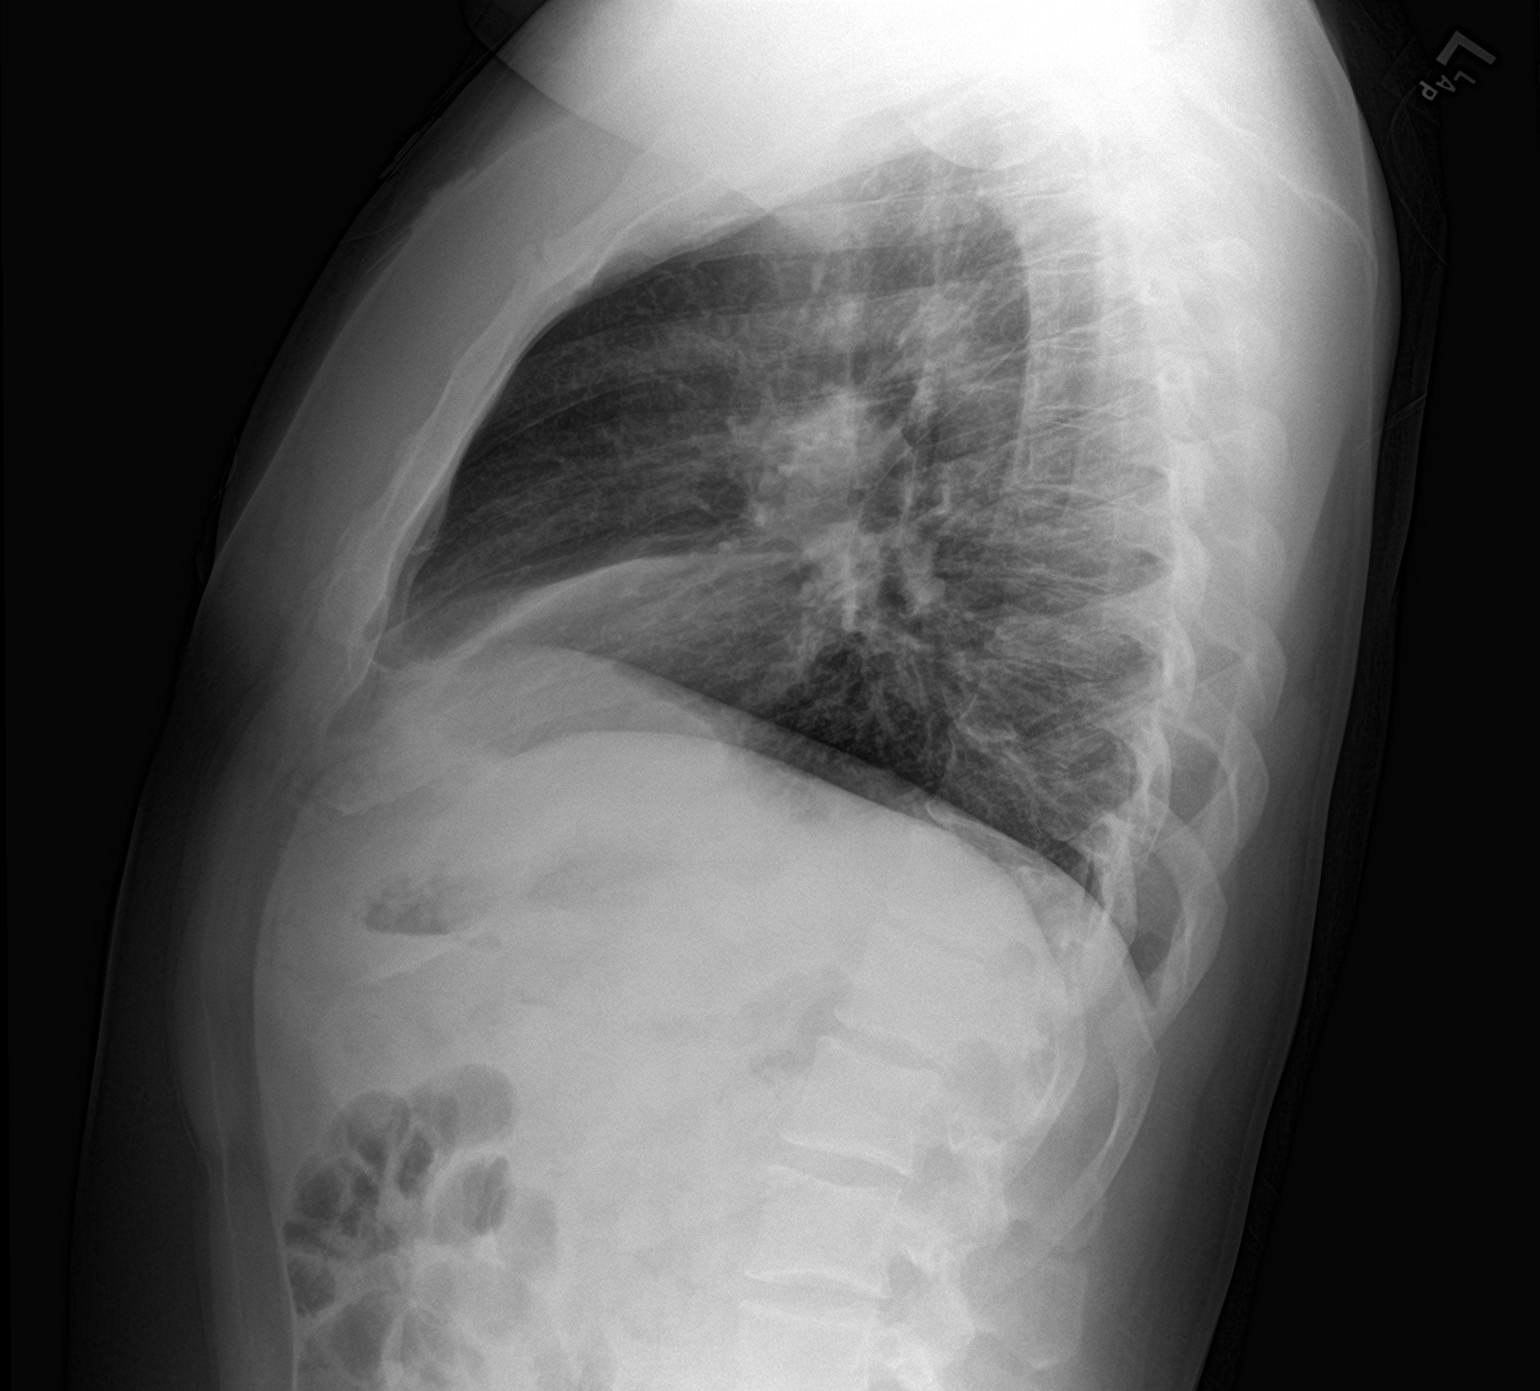

[2 of 2 positions shown; findings below may reference images not displayed]

FINDINGS: Right infrahilar airspace opacity is again noted concerning for
infiltrate/pneumonia. Patchy opacities now present in the left
perihilar and infrahilar regions. Heart is normal size. No
effusions. No acute bony abnormality.
IMPRESSION: Patchy bilateral airspace opacities concerning for multifocal
pneumonia.

## 2018-10-01 ENCOUNTER — Encounter: Payer: Self-pay | Admitting: Emergency Medicine

## 2018-10-01 ENCOUNTER — Emergency Department
Admission: EM | Admit: 2018-10-01 | Discharge: 2018-10-01 | Disposition: A | Discharge status: Home / Self Care | Payer: BC Managed Care – PPO | Attending: Emergency Medicine | Admitting: Emergency Medicine

## 2018-10-01 ENCOUNTER — Other Ambulatory Visit: Payer: Self-pay

## 2018-10-01 DIAGNOSIS — M25512 Pain in left shoulder: Secondary | ICD-10-CM | POA: Diagnosis present

## 2018-10-01 DIAGNOSIS — M542 Cervicalgia: Secondary | ICD-10-CM | POA: Diagnosis not present

## 2018-10-01 DIAGNOSIS — J45909 Unspecified asthma, uncomplicated: Secondary | ICD-10-CM | POA: Diagnosis not present

## 2018-10-01 DIAGNOSIS — M62838 Other muscle spasm: Secondary | ICD-10-CM | POA: Insufficient documentation

## 2018-10-01 DIAGNOSIS — Z79899 Other long term (current) drug therapy: Secondary | ICD-10-CM | POA: Insufficient documentation

## 2018-10-01 MED ORDER — MELOXICAM 15 MG PO TABS: 15.0000 mg | ORAL_TABLET | Freq: Every day | ORAL | 0 refills | Status: AC

## 2018-10-01 MED ORDER — KETOROLAC TROMETHAMINE 30 MG/ML IJ SOLN
30.0000 mg | Freq: Once | INTRAMUSCULAR | Status: AC
  Administered 2018-10-01: 30 mg via INTRAMUSCULAR
  Filled 2018-10-01: qty 1

## 2018-10-01 MED ORDER — METHOCARBAMOL 500 MG PO TABS: 500.0000 mg | ORAL_TABLET | Freq: Four times a day (QID) | ORAL | 0 refills | Status: AC

## 2018-10-01 NOTE — ED Provider Notes (Signed)
[facility] Emergency Department Provider Note  ____________________________________________  Time seen: Approximately 6:33 PM  I have reviewed the triage vital signs and the nursing notes.   HISTORY  Chief Complaint Shoulder Pain    HPI Robert Fortslfonso Vance is a 43 y.o. male who presents the emergency department complaining of left shoulder pain.  Apparently patient was lifting heavy weights, and developed neck pain and shoulder pain after doing so.  Patient is taken Aleve without any relief.  Patient denies any radicular symptoms.  No direct trauma.  Patient denies any headache, chest pain or shortness of breath.         Past Medical History:  Diagnosis Date  . Asthma     Patient Active Problem List   Diagnosis Date Noted  . Acute respiratory failure (HCC) 04/28/2017  . Asthma exacerbation 01/26/2017  . Hypoxia 09/14/2016    Past Surgical History:  Procedure Laterality Date  . none      Prior to Admission medications   Medication Sig Start Date End Date Taking? Authorizing Provider  albuterol (PROVENTIL HFA;VENTOLIN HFA) 108 (90 Base) MCG/ACT inhaler Inhale 2 puffs into the lungs every 6 (six) hours as needed for wheezing or shortness of breath. 04/30/17   Shaune Pollackhen, Qing, MD  fluticasone furoate-vilanterol (BREO ELLIPTA) 200-25 MCG/INH AEPB Inhale 1 puff into the lungs daily. 03/07/17   [provider]  meloxicam (MOBIC) 15 MG tablet Take 1 tablet (15 mg total) by mouth daily. 10/01/18   Jaymir Struble, Delorise RoyalsJonathan D, PA-C  methocarbamol (ROBAXIN) 500 MG tablet Take 1 tablet (500 mg total) by mouth 4 (four) times daily. 10/01/18   Emmalee Solivan, Delorise RoyalsJonathan D, PA-C    Allergies Shellfish allergy  Family History  Problem Relation Age of Onset  . COPD Father   . CAD Neg Hx   . Diabetes Mellitus II Neg Hx   . Hypertension Neg Hx     Social History Social History   Tobacco Use  . Smoking status: Never Smoker  . Smokeless tobacco: Never Used  Substance  Use Topics  . Alcohol use: No  . Drug use: No     Review of Systems  Constitutional: No fever/chills Eyes: No visual changes. No discharge ENT: No upper respiratory complaints. Cardiovascular: no chest pain. Respiratory: no cough. No SOB. Gastrointestinal: No abdominal pain.  No nausea, no vomiting.  Musculoskeletal: Positive for left shoulder pain Skin: Negative for rash, abrasions, lacerations, ecchymosis. Neurological: Negative for headaches, focal weakness or numbness. 10-point ROS otherwise negative.  ____________________________________________   PHYSICAL EXAM:  VITAL SIGNS: ED Triage Vitals  Enc Vitals Group     BP 10/01/18 1734 131/79     Pulse Rate 10/01/18 1734 96     Resp 10/01/18 1734 18     Temp 10/01/18 1734 99.2 F (37.3 C)     Temp Source 10/01/18 1734 Oral     SpO2 10/01/18 1734 96 %     Weight 10/01/18 1731 170 lb (77.1 kg)     Height 10/01/18 1731 5\' 7"  (1.702 m)     Head Circumference --      Peak Flow --      Pain Score 10/01/18 1731 6     Pain Loc --      Pain Edu? --      Excl. in [place]? --      Constitutional: Alert and oriented. Well appearing and in no acute distress. Eyes: Conjunctivae are normal. PERRL. EOMI. Head: Atraumatic. ENT:      Ears:  Nose: No congestion/rhinnorhea.      Mouth/Throat: Mucous membranes are moist.  Neck: No stridor.  No cervical spine tenderness to palpation.  Cardiovascular: Normal rate, regular rhythm. Normal S1 and S2.  Good peripheral circulation. Respiratory: Normal respiratory effort without tachypnea or retractions. Lungs CTAB. Good air entry to the bases with no decreased or absent breath sounds. Musculoskeletal: Full range of motion to all extremities. No gross deformities appreciated.  Evaluation of the left shoulder reveals tenderness and palpable spasms along the trapezius muscle.  No other tenderness to palpation.  No other palpable abnormality.  Full range of motion to the left shoulder.  Radial  pulse intact bilateral upper extremities.  Sensation intact and equal bilateral upper extremities. Neurologic:  Normal speech and language. No gross focal neurologic deficits are appreciated.  Skin:  Skin is warm, dry and intact. No rash noted. Psychiatric: Mood and affect are normal. Speech and behavior are normal. Patient exhibits appropriate insight and judgement.   ____________________________________________   LABS (all labs ordered are listed, but only abnormal results are displayed)  Labs Reviewed - No data to display ____________________________________________  EKG   ____________________________________________  RADIOLOGY   No results found.  ____________________________________________    PROCEDURES  Procedure(s) performed:    Procedures    Medications  ketorolac (TORADOL) 30 MG/ML injection 30 mg (30 mg Intramuscular Given 10/01/18 1851)     ____________________________________________   INITIAL IMPRESSION / ASSESSMENT AND PLAN / ED COURSE  Pertinent labs & imaging results that were available during my care of the patient were reviewed by me and considered in my medical decision making (see chart for details).  Review of the [place] CSRS was performed in accordance of the [place] prior to dispensing any controlled drugs.           Patient's diagnosis is consistent with trapezius muscle spasm on the left shoulder.  Patient presented to the emergency department complaining of left shoulder pain after lifting weights.  Findings are consistent with muscle spasm.  No evidence of acute ligamentous rupture.  No indication for imaging or labs at this time.  Patiently placed on anti-inflammatory muscle relaxer for symptom relief.  Drink plenty of fluids.  Follow-up primary care as needed..  Patient is given ED precautions to return to the ED for any worsening or new symptoms.     ____________________________________________  FINAL CLINICAL IMPRESSION(S) / ED  DIAGNOSES  Final diagnoses:  Trapezius muscle spasm      NEW MEDICATIONS STARTED DURING THIS VISIT:  ED Discharge Orders         Ordered    meloxicam (MOBIC) 15 MG tablet  Daily     10/01/18 1844    methocarbamol (ROBAXIN) 500 MG tablet  4 times daily     10/01/18 1844              This chart was dictated using voice recognition software/Dragon. Despite best efforts to proofread, errors can occur which can change the meaning. Any change was purely unintentional.    Darletta Moll, PA-C 10/01/18 1901    Nena Polio, MD 10/01/18 [phone]

## 2018-10-01 NOTE — ED Notes (Signed)
See triage note  Presents with pain to neck and shoulder  States he woke up on Saturday am with the pain   States he thinks he may have worked out too much  Taken 2 Aleve PTA and min relief

## 2018-10-01 NOTE — ED Triage Notes (Signed)
-  pt c/o pain left neck and shoulder. Started day after lifting weights.  Raising shoulder and movement of neck make pain worse. Ambulatory with check in.

## 2019-03-09 ENCOUNTER — Other Ambulatory Visit: Payer: Self-pay

## 2019-03-09 ENCOUNTER — Encounter: Payer: Self-pay | Admitting: Intensive Care

## 2019-03-09 ENCOUNTER — Emergency Department
Admission: EM | Admit: 2019-03-09 | Discharge: 2019-03-09 | Disposition: A | Discharge status: Home / Self Care | Payer: BC Managed Care – PPO | Attending: Emergency Medicine | Admitting: Emergency Medicine

## 2019-03-09 DIAGNOSIS — T7840XA Allergy, unspecified, initial encounter: Secondary | ICD-10-CM | POA: Insufficient documentation

## 2019-03-09 DIAGNOSIS — Z79899 Other long term (current) drug therapy: Secondary | ICD-10-CM | POA: Diagnosis not present

## 2019-03-09 DIAGNOSIS — J45909 Unspecified asthma, uncomplicated: Secondary | ICD-10-CM | POA: Diagnosis not present

## 2019-03-09 MED ORDER — PREDNISONE 10 MG PO TABS: ORAL_TABLET | ORAL | 0 refills | Status: AC

## 2019-03-09 MED ORDER — EPINEPHRINE 0.3 MG/0.3ML IJ SOAJ: 0.3000 mg | INTRAMUSCULAR | 0 refills | Status: AC | PRN

## 2019-03-09 MED ORDER — FAMOTIDINE 20 MG PO TABS
20.0000 mg | ORAL_TABLET | Freq: Once | ORAL | Status: AC
  Administered 2019-03-09: 20:00:00 20 mg via ORAL
  Filled 2019-03-09: qty 1

## 2019-03-09 MED ORDER — DIPHENHYDRAMINE HCL 25 MG PO CAPS: 25.0000 mg | ORAL_CAPSULE | ORAL | 0 refills | Status: AC | PRN

## 2019-03-09 MED ORDER — METHYLPREDNISOLONE SODIUM SUCC 125 MG IJ SOLR
80.0000 mg | Freq: Once | INTRAMUSCULAR | Status: AC
  Administered 2019-03-09: 20:00:00 80 mg via INTRAMUSCULAR
  Filled 2019-03-09: qty 2

## 2019-03-09 NOTE — ED Provider Notes (Signed)
[facility] Emergency Department Provider Note  ____________________________________________  Time seen: Approximately 7:56 PM  I have reviewed the triage vital signs and the nursing notes.   HISTORY  Chief Complaint Allergic Reaction    HPI Robert Vance is a 44 y.o. male that presents to the emergency department for evaluation of allergic reaction since yesterday.  Patient states that hives and bilateral eye swelling started yesterday after patient ate a chicken wrap from a vending machine at work.  Patient states that eyes and rash itch.  Patient has been taking Benadryl.  He last took Benadryl at 3 PM. Itching improved after he took the benadryl Patient states that he has several food allergies.  He has not yet looked at the wrapper to see what may have been in at that he is allergic to.  Patient states that he also has been taking a weight loss over-the-counter supplement for the last 3 weeks.  He does not have an EpiPen.  Patient states that he has had facial swelling from food allergies 2 times previously.  One time was from homemade gumbo and the other was from a new cereal with a different kind of wheat in it.  Patient does have an allergist at [facility].  Last time patient had an allergic reaction to food, he received 40 mg of methylprednisolone at [facility] clinic.  No new soaps, detergents, lotions. No shortness of breath, throat tingling, nausea, vomiting, abdominal pain, diarrhea.  Past Medical History:  Diagnosis Date  . Asthma     Patient Active Problem List   Diagnosis Date Noted  . Acute respiratory failure (HCC) 04/28/2017  . Asthma exacerbation 01/26/2017  . Hypoxia 09/14/2016    Past Surgical History:  Procedure Laterality Date  . none      Prior to Admission medications   Medication Sig Start Date End Date Taking? Authorizing Provider  albuterol (PROVENTIL HFA;VENTOLIN HFA) 108 (90 Base) MCG/ACT inhaler Inhale 2 puffs into the lungs every  6 (six) hours as needed for wheezing or shortness of breath. 04/30/17   Shaune Pollack, MD  diphenhydrAMINE (BENADRYL) 25 mg capsule Take 1 capsule (25 mg total) by mouth every 4 (four) hours as needed. 03/09/19 03/08/20  Enid Derry, PA-C  EPINEPHrine 0.3 mg/0.3 mL IJ SOAJ injection Inject 0.3 mLs (0.3 mg total) into the muscle as needed for anaphylaxis. 03/09/19   Enid Derry, PA-C  fluticasone furoate-vilanterol (BREO ELLIPTA) 200-25 MCG/INH AEPB Inhale 1 puff into the lungs daily. 03/07/17   [provider]  meloxicam (MOBIC) 15 MG tablet Take 1 tablet (15 mg total) by mouth daily. 10/01/18   Cuthriell, Delorise Royals, PA-C  methocarbamol (ROBAXIN) 500 MG tablet Take 1 tablet (500 mg total) by mouth 4 (four) times daily. 10/01/18   Cuthriell, Delorise Royals, PA-C  predniSONE (DELTASONE) 10 MG tablet Take 6 tablets on day 1, take 5 tablets on day 2, take 4 tablets on day 3, take 3 tablets on day 4, take 2 tablets on day 5, take 1 tablet on day 6 03/09/19   Enid Derry, PA-C    Allergies Shellfish allergy  Family History  Problem Relation Age of Onset  . COPD Father   . CAD Neg Hx   . Diabetes Mellitus II Neg Hx   . Hypertension Neg Hx     Social History Social History   Tobacco Use  . Smoking status: Never Smoker  . Smokeless tobacco: Never Used  Substance Use Topics  . Alcohol use: No  .  Drug use: No     Review of Systems  Respiratory: No SOB. Gastrointestinal: No abdominal pain.  No nausea, no vomiting.  Musculoskeletal: Negative for musculoskeletal pain. Skin: Negative for abrasions, lacerations, ecchymosis. Positive for rash. Neurological: Negative for headaches, numbness or tingling   ____________________________________________   PHYSICAL EXAM:  VITAL SIGNS: ED Triage Vitals  Enc Vitals Group     BP 03/09/19 1838 (!) 148/86     Pulse Rate 03/09/19 1838 87     Resp 03/09/19 1838 16     Temp 03/09/19 1838 98 F (36.7 C)     Temp Source 03/09/19 1838 Oral      SpO2 03/09/19 1838 98 %     Weight 03/09/19 1835 178 lb (80.7 kg)     Height 03/09/19 1835 5\' 7"  (1.702 m)     Head Circumference --      Peak Flow --      Pain Score 03/09/19 1835 0     Pain Loc --      Pain Edu? --      Excl. in [place]? --      Constitutional: Alert and oriented. Well appearing and in no acute distress. Eyes: Conjunctivae are normal. PERRL. EOMI. Head: Atraumatic. ENT: Mild swelling to bilateral eyes.      Ears:      Nose: No congestion/rhinnorhea.      Mouth/Throat: Mucous membranes are moist.  Neck: No stridor.   Cardiovascular: Normal rate, regular rhythm.  Good peripheral circulation. Respiratory: Normal respiratory effort without tachypnea or retractions. Lungs CTAB. Good air entry to the bases with no decreased or absent breath sounds. Musculoskeletal: Full range of motion to all extremities. No gross deformities appreciated. Neurologic:  Normal speech and language. No gross focal neurologic deficits are appreciated.  Skin:  Skin is warm, dry and intact. Hives to BUE and neck. Psychiatric: Mood and affect are normal. Speech and behavior are normal. Patient exhibits appropriate insight and judgement.   ____________________________________________   LABS (all labs ordered are listed, but only abnormal results are displayed)  Labs Reviewed - No data to display ____________________________________________  EKG   ____________________________________________  RADIOLOGY  No results found.  ____________________________________________    PROCEDURES  Procedure(s) performed:    Procedures    Medications  methylPREDNISolone sodium succinate (SOLU-MEDROL) 125 mg/2 mL injection 80 mg (80 mg Intramuscular Given 03/09/19 1943)  famotidine (PEPCID) tablet 20 mg (20 mg Oral Given 03/09/19 1943)     ____________________________________________   INITIAL IMPRESSION / ASSESSMENT AND PLAN / ED COURSE  Pertinent labs & imaging results that were  available during my care of the patient were reviewed by me and considered in my medical decision making (see chart for details).  Review of the [place] CSRS was performed in accordance of the [place] prior to dispensing any controlled drugs.   Patient's diagnosis is consistent with allergic reaction.  Patient was given IM Solu-Medrol and Pepcid in the emergency department.  He last took Benadryl 3 hours prior. Patient feels improved. Patient will be discharged home with prescriptions for prednisone and Benadryl. Patient is to follow up with primary care as directed. Patient is given ED precautions to return to the ED for any worsening or new symptoms.  Robert Vance was evaluated in Emergency Department on 03/09/2019 for the symptoms described in the history of present illness. He was evaluated in the context of the global COVID-19 pandemic, which necessitated consideration that the patient might be at risk for infection with the  SARS-CoV-2 virus that causes COVID-19. Institutional protocols and algorithms that pertain to the evaluation of patients at risk for COVID-19 are in a state of rapid change based on information released by regulatory bodies including the CDC and federal and state organizations. These policies and algorithms were followed during the patient's care in the ED.   ____________________________________________  FINAL CLINICAL IMPRESSION(S) / ED DIAGNOSES  Final diagnoses:  Allergic reaction, initial encounter      NEW MEDICATIONS STARTED DURING THIS VISIT:  ED Discharge Orders         Ordered    predniSONE (DELTASONE) 10 MG tablet     03/09/19 2035    diphenhydrAMINE (BENADRYL) 25 mg capsule  Every 4 hours PRN     03/09/19 2035    EPINEPHrine 0.3 mg/0.3 mL IJ SOAJ injection  As needed     03/09/19 2036              This chart was dictated using voice recognition software/Dragon. Despite best efforts to proofread, errors can occur which can change the meaning. Any  change was purely unintentional.    Enid Derry, PA-C 03/09/19 2213    Phineas Semen, MD 03/09/19 [phone]

## 2019-03-09 NOTE — ED Triage Notes (Addendum)
First RN Note: Pt presents to ED via POV with c/o allergic reaction. Pt states unknown what he could be having reaction to. Pt states allergic reaction started last night. Pt states rash to R arm and neck, pt with swelling to bilateral eyes at this time.   Pt ambulatory without difficulty at this time, able to speak in full and complete sentences at this time. Respirations even and unlabored, no airway involvement noted at this time.

## 2019-03-09 NOTE — ED Notes (Signed)
See triage note. Pt here with allergic reaction. Pt has hives present on neck, bilateral arms. Bilateral eyes swollen. Denies airway symptoms.   Pt in NAD at this time.

## 2022-07-16 ENCOUNTER — Emergency Department: Payer: BC Managed Care – PPO

## 2022-07-16 ENCOUNTER — Emergency Department
Admission: EM | Admit: 2022-07-16 | Discharge: 2022-07-16 | Disposition: A | Discharge status: Home / Self Care | Payer: BC Managed Care – PPO | Attending: Emergency Medicine | Admitting: Emergency Medicine

## 2022-07-16 ENCOUNTER — Encounter: Payer: Self-pay | Admitting: Radiology

## 2022-07-16 ENCOUNTER — Other Ambulatory Visit: Payer: Self-pay

## 2022-07-16 DIAGNOSIS — J45909 Unspecified asthma, uncomplicated: Secondary | ICD-10-CM | POA: Insufficient documentation

## 2022-07-16 DIAGNOSIS — X58XXXA Exposure to other specified factors, initial encounter: Secondary | ICD-10-CM | POA: Insufficient documentation

## 2022-07-16 DIAGNOSIS — M79662 Pain in left lower leg: Secondary | ICD-10-CM | POA: Diagnosis present

## 2022-07-16 DIAGNOSIS — S86812A Strain of other muscle(s) and tendon(s) at lower leg level, left leg, initial encounter: Secondary | ICD-10-CM | POA: Insufficient documentation

## 2022-07-16 MED ORDER — ACETAMINOPHEN 500 MG PO TABS
1000.0000 mg | ORAL_TABLET | Freq: Once | ORAL | Status: AC
  Administered 2022-07-16: 1000 mg via ORAL
  Filled 2022-07-16: qty 2

## 2022-07-16 MED ORDER — IBUPROFEN 400 MG PO TABS
400.0000 mg | ORAL_TABLET | Freq: Once | ORAL | Status: AC
  Administered 2022-07-16: 400 mg via ORAL
  Filled 2022-07-16: qty 1

## 2022-07-16 NOTE — Discharge Instructions (Signed)
Your ultrasound did not show any blood clot.  You may have strained or torn your calf muscle.  Take Tylenol Motrin and ice the leg.  If you notice any increasing swelling then please follow-up with your primary doctor for repeat evaluation.

## 2022-07-16 NOTE — ED Provider Notes (Addendum)
[facility] Provider Note    Event Date/Time   First MD Initiated Contact with Patient 07/16/22 1334     (approximate)   History   calf pain   HPI  Alam Guterrez is a 47 y.o. male past medical history of asthma who presents with calf pain.  Patient says he was wrestling with his children yesterday and thinks he may have hurt his calf.  He worked all night and was walking around did not feel pain until late last night.  Pain is primarily in the left calf worse with walking.  Denies associated swelling no numbness tingling.  Does not remember a specific event that brought the pain on.  No history of DVT/PE denies dyspnea chest pain hemoptysis cancer or recent travel or surgery.     Past Medical History:  Diagnosis Date   Asthma     Patient Active Problem List   Diagnosis Date Noted   Acute respiratory failure (HCC) 04/28/2017   Asthma exacerbation 01/26/2017   Hypoxia 09/14/2016     Physical Exam  Triage Vital Signs: ED Triage Vitals [07/16/22 1312]  Enc Vitals Group     BP 137/85     Pulse Rate (!) 111     Resp 17     Temp 98.2 F (36.8 C)     Temp Source Oral     SpO2 95 %     Weight 190 lb (86.2 kg)     Height 5\' 7"  (1.702 m)     Head Circumference      Peak Flow      Pain Score      Pain Loc      Pain Edu?      Excl. in GC?     Most recent vital signs: Vitals:   07/16/22 1312  BP: 137/85  Pulse: (!) 111  Resp: 17  Temp: 98.2 F (36.8 C)  SpO2: 95%     General: Awake, no distress.  CV:  Good peripheral perfusion.  Resp:  Normal effort.  Abd:  No distention.  Neuro:             Awake, Alert, Oriented x 3  Other:  There is mild pitting edema both lower extremities, calves are symmetric, 2+ DP pulse bilaterally, intact plantarflexion dorsiflexion, negative Thompson sign, no Achilles tenderness Tenderness to palpation over the posterior mid calf, no bruising   ED Results / Procedures / Treatments  Labs (all labs  ordered are listed, but only abnormal results are displayed) Labs Reviewed - No data to display   EKG     RADIOLOGY I reviewed interpreted the DVT study which is negative for DVT   PROCEDURES:  Critical Care performed: No  Procedures   MEDICATIONS ORDERED IN ED: Medications  acetaminophen (TYLENOL) tablet 1,000 mg (1,000 mg Oral Given 07/16/22 1354)  ibuprofen (ADVIL) tablet 400 mg (400 mg Oral Given 07/16/22 1355)     IMPRESSION / MDM / ASSESSMENT AND PLAN / ED COURSE  I reviewed the triage vital signs and the nursing notes.                              Patient's presentation is most consistent with acute complicated illness / injury requiring diagnostic workup.  Differential diagnosis includes, but is not limited to, gastrocs strain or tear, DVT, Baker's cyst  47 year old male presents with left calf pain.  He was doing activity with his  children yesterday was wrestling but did not feel pain immediately until the evening.  Pain is in the left calf worse with movement denies numbness or weakness.  On exam he does have some mild pitting edema in both calves but they are symmetric he has good pulses and is neurovascular intact negative Thompson sign no pain over the Achilles.  He has tenderness to palpation over the left posterior calf.  I suspect this is likely musculoskeletal gastroc strain but DVT is also possible.  Will obtain a DVT study and and give Tylenol Motrin.  DVT study is negative.  Discussed repeat evaluation if he develops increasing swelling.     FINAL CLINICAL IMPRESSION(S) / ED DIAGNOSES   Final diagnoses:  Strain of calf muscle, left, initial encounter     Rx / DC Orders   ED Discharge Orders     None        Note:  This document was prepared using Dragon voice recognition software and may include unintentional dictation errors.   Georga Hacking, MD 07/16/22 1530    Georga Hacking, MD 07/16/22 [phone]

## 2022-07-16 NOTE — ED Triage Notes (Signed)
Pt states he started having calf pain yesterday. Pt thinks he may have pulled a muscle while running with his children.

## 2022-12-10 ENCOUNTER — Other Ambulatory Visit: Payer: Self-pay

## 2022-12-10 ENCOUNTER — Emergency Department: Payer: BC Managed Care – PPO

## 2022-12-10 ENCOUNTER — Emergency Department
Admission: EM | Admit: 2022-12-10 | Discharge: 2022-12-10 | Disposition: A | Discharge status: Home / Self Care | Payer: BC Managed Care – PPO | Attending: Emergency Medicine | Admitting: Emergency Medicine

## 2022-12-10 DIAGNOSIS — M25512 Pain in left shoulder: Secondary | ICD-10-CM | POA: Insufficient documentation

## 2022-12-10 DIAGNOSIS — J45909 Unspecified asthma, uncomplicated: Secondary | ICD-10-CM | POA: Insufficient documentation

## 2022-12-10 NOTE — ED Triage Notes (Signed)
Pt to ED via POV from home. Pt reports intermittent left arm pain x1 month. Pt denies injury but states he got a shot in his left arm a month ago and believes it is from that. Pt denies CP, SOB, fever, HA, N/V/D.

## 2022-12-10 NOTE — ED Provider Notes (Signed)
[facility] Provider Note    Event Date/Time   First MD Initiated Contact with Patient 12/10/22 [phone]     (approximate)   History   Shoulder Pain (Left )   HPI  Robert Vance is a 47 y.o. male with a past medical history of asthma who presents today for evaluation of left shoulder pain that has been ongoing for approximately 1 month.  Patient reports that he has pain when doing overhead movements with his left arm or when he is at the gym.  He denies any injuries.  He does report that he had a tetanus shot in this arm a few weeks ago and he is wondering if this is related.  He denies any other injuries.  He denies chest pain or shortness of breath.  He denies back pain.  He denies any weakness or paresthesias in his arm or hand.  No neck pain.  Patient Active Problem List   Diagnosis Date Noted   Acute respiratory failure (HCC) 04/28/2017   Asthma exacerbation 01/26/2017   Hypoxia 09/14/2016          Physical Exam   Triage Vital Signs: ED Triage Vitals  Encounter Vitals Group     BP 12/10/22 0935 (!) 135/94     Systolic BP Percentile --      Diastolic BP Percentile --      Pulse Rate 12/10/22 0934 84     Resp 12/10/22 0934 20     Temp 12/10/22 0934 97.8 F (36.6 C)     Temp Source 12/10/22 0934 Oral     SpO2 12/10/22 0934 97 %     Weight --      Height --      Head Circumference --      Peak Flow --      Pain Score 12/10/22 0934 5     Pain Loc --      Pain Education --      Exclude from Growth Chart --     Most recent vital signs: Vitals:   12/10/22 0934 12/10/22 0935  BP:  (!) 135/94  Pulse: 84   Resp: 20   Temp: 97.8 F (36.6 C)   SpO2: 97%     Physical Exam Vitals and nursing note reviewed.  Constitutional:      General: Awake and alert. No acute distress.    Appearance: Normal appearance. The patient is normal weight.  HENT:     Head: Normocephalic and atraumatic.     Mouth: Mucous membranes are moist.  Eyes:      General: PERRL. Normal EOMs        Right eye: No discharge.        Left eye: No discharge.     Conjunctiva/sclera: Conjunctivae normal.  Cardiovascular:     Rate and Rhythm: Normal rate and regular rhythm.     Pulses: Normal pulses.  Pulmonary:     Effort: Pulmonary effort is normal. No respiratory distress.     Breath sounds: Normal breath sounds.  Abdominal:     Abdomen is soft. There is no abdominal tenderness. No rebound or guarding. No distention. Musculoskeletal:        General: No swelling. Normal range of motion.     Cervical back: Normal range of motion and neck supple. No midline cervical spine tenderness.  Full range of motion of neck.  Negative Spurling test.  Negative Lhermitte sign.  Normal strength and sensation in bilateral upper extremities. Normal  grip strength bilaterally.  Normal intrinsic muscle function of the hand bilaterally.  Normal radial pulses bilaterally. Left shoulder: No obvious deformity, swelling, ecchymosis, or erythema.  Mild anterior and lateral shoulder joint line tenderness.  No warmth. No clavicular or AC joint tenderness Able to actively and passively forward flex and abduct at shoulder fully, negative drop arm test Negative Obriens, SLAP, empty can, and lift off tests though mild discomfort with doing so. Normal internal and external rotation against resistance Normal ROM at elbow and wrist Normal resisted pronation and supination 2+ radial pulse Normal grip strength Normal intrinsic hand muscle function Skin:    General: Skin is warm and dry.     Capillary Refill: Capillary refill takes less than 2 seconds.     Findings: No rash.  Neurological:     Mental Status: The patient is awake and alert.      ED Results / Procedures / Treatments   Labs (all labs ordered are listed, but only abnormal results are displayed) Labs Reviewed - No data to display   EKG     RADIOLOGY I independently reviewed and interpreted imaging and agree with  radiologists findings.     PROCEDURES:  Critical Care performed:   Procedures   MEDICATIONS ORDERED IN ED: Medications - No data to display   IMPRESSION / MDM / ASSESSMENT AND PLAN / ED COURSE  I reviewed the triage vital signs and the nursing notes.   Differential diagnosis includes, but is not limited to, musculoskeletal etiology, tendinopathy, rotator cuff strain, reaction from tetanus vaccination.  Patient is awake and alert, hemodynamically stable and afebrile.  He also considered acute coronary syndrome given his left arm pain, and recommended further workup including labs, EKG, chest x-ray, though patient declined.  He understands the risk of delayed or missed diagnosis.  He has no cervical spine tenderness, negative Spurling and Lhermitte signs, normal strength and sensation in bilateral upper extremities, normal grip strength bilaterally, I do not suspect cervical etiology.  He wants an x-ray of his shoulder only, does not want any other workup done today.  X-ray of his shoulder is normal.  His pain is reproducible with movement of his shoulder, suspicious for musculoskeletal etiology.  I recommended outpatient follow-up with orthopedics and strict return precautions.  Patient understands and agrees with plan.  He was discharged in stable condition.   Patient's presentation is most consistent with acute complicated illness / injury requiring diagnostic workup.   FINAL CLINICAL IMPRESSION(S) / ED DIAGNOSES   Final diagnoses:  Left shoulder pain, unspecified chronicity     Rx / DC Orders   ED Discharge Orders     None        Note:  This document was prepared using Dragon voice recognition software and may include unintentional dictation errors.   Keturah Shavers 12/10/22 1233    Chesley Noon, MD 12/10/22 1435

## 2022-12-10 NOTE — Discharge Instructions (Signed)
Your x-ray did not reveal any obvious abnormalities.  It was recommended that you undergo blood tests but you did not wish to do this.  Please follow-up with orthopedics.  Please return for any new, worsening, or change in symptoms or other concerns.  It was a pleasure caring for you today.

## 2024-01-21 ENCOUNTER — Emergency Department
Admission: EM | Admit: 2024-01-21 | Discharge: 2024-01-21 | Disposition: A | Discharge status: Home / Self Care | Attending: Emergency Medicine | Admitting: Emergency Medicine

## 2024-01-21 ENCOUNTER — Emergency Department

## 2024-01-21 ENCOUNTER — Other Ambulatory Visit: Payer: Self-pay

## 2024-01-21 DIAGNOSIS — R0602 Shortness of breath: Secondary | ICD-10-CM | POA: Diagnosis present

## 2024-01-21 DIAGNOSIS — J45901 Unspecified asthma with (acute) exacerbation: Secondary | ICD-10-CM | POA: Diagnosis not present

## 2024-01-21 LAB — COMPREHENSIVE METABOLIC PANEL WITH GFR
ALT: 20 U/L (ref 0–44)
AST: 24 U/L (ref 15–41)
Albumin: 4.3 g/dL (ref 3.5–5.0)
Alkaline Phosphatase: 129 U/L — ABNORMAL HIGH (ref 38–126)
Anion gap: 11 (ref 5–15)
BUN: 19 mg/dL (ref 6–20)
CO2: 24 mmol/L (ref 22–32)
Calcium: 9.3 mg/dL (ref 8.9–10.3)
Chloride: 105 mmol/L (ref 98–111)
Creatinine, Ser: 1.43 mg/dL — ABNORMAL HIGH (ref 0.61–1.24)
GFR, Estimated: 60 mL/min — ABNORMAL LOW
Glucose, Bld: 134 mg/dL — ABNORMAL HIGH (ref 70–99)
Potassium: 4 mmol/L (ref 3.5–5.1)
Sodium: 140 mmol/L (ref 135–145)
Total Bilirubin: <0.2 mg/dL (ref 0.0–1.2)
Total Protein: 7.3 g/dL (ref 6.5–8.1)

## 2024-01-21 LAB — RESP PANEL BY RT-PCR (RSV, FLU A&B, COVID)  RVPGX2
Influenza A by PCR: NEGATIVE
Influenza B by PCR: NEGATIVE
Resp Syncytial Virus by PCR: NEGATIVE
SARS Coronavirus 2 by RT PCR: NEGATIVE

## 2024-01-21 LAB — CBC
HCT: 40.9 % (ref 39.0–52.0)
Hemoglobin: 13.6 g/dL (ref 13.0–17.0)
MCH: 28.2 pg (ref 26.0–34.0)
MCHC: 33.3 g/dL (ref 30.0–36.0)
MCV: 84.9 fL (ref 80.0–100.0)
Platelets: 316 K/uL (ref 150–400)
RBC: 4.82 MIL/uL (ref 4.22–5.81)
RDW: 13.7 % (ref 11.5–15.5)
WBC: 8.1 K/uL (ref 4.0–10.5)
nRBC: 0 % (ref 0.0–0.2)

## 2024-01-21 MED ORDER — IPRATROPIUM-ALBUTEROL 0.5-2.5 (3) MG/3ML IN SOLN
3.0000 mL | Freq: Once | RESPIRATORY_TRACT | Status: AC
  Administered 2024-01-21: 3 mL via RESPIRATORY_TRACT
  Filled 2024-01-21: qty 3

## 2024-01-21 MED ORDER — METHYLPREDNISOLONE SODIUM SUCC 125 MG IJ SOLR
125.0000 mg | Freq: Once | INTRAMUSCULAR | Status: AC
  Administered 2024-01-21: 125 mg via INTRAVENOUS
  Filled 2024-01-21: qty 2

## 2024-01-21 MED ORDER — IBUPROFEN 400 MG PO TABS
400.0000 mg | ORAL_TABLET | Freq: Once | ORAL | Status: AC
  Administered 2024-01-21: 400 mg via ORAL
  Filled 2024-01-21: qty 1

## 2024-01-21 MED ORDER — PREDNISONE 10 MG (21) PO TBPK: ORAL_TABLET | ORAL | 0 refills | Status: AC

## 2024-01-21 NOTE — ED Triage Notes (Signed)
 Pt presents for shortness of breath and I think I caught a cold. Hx asthma. Inhaler has not helped. Denies known fevers, chills, nausea, vomiting, diarrhea.

## 2024-01-21 NOTE — ED Provider Notes (Signed)
 "  [facility] Provider Note    Event Date/Time   First MD Initiated Contact with Patient 01/21/24 0254     (approximate)   History   Shortness of Breath   HPI  Robert Vance is a 48 y.o. male who presents to the emergency department today because of concerns for shortness of breath.  Patient has history of asthma.  States that he started noticing some shortness of breath a couple of days ago.  He then was walking yesterday became worse.  He states that he is allergic and will have reactions to lots of different things.  He does have chest tightness across his chest.  No fevers.  He has been trying his home inhaler without any significant relief.     Physical Exam   Triage Vital Signs: ED Triage Vitals [01/21/24 0251]  Encounter Vitals Group     BP (!) 133/90     Girls Systolic BP Percentile      Girls Diastolic BP Percentile      Boys Systolic BP Percentile      Boys Diastolic BP Percentile      Pulse Rate (!) 111     Resp (!) 26     Temp 99.3 F (37.4 C)     Temp Source Oral     SpO2 91 %     Weight 197 lb (89.4 kg)     Height 5' 8 (1.727 m)     Head Circumference      Peak Flow      Pain Score 0     Pain Loc      Pain Education      Exclude from Growth Chart     Most recent vital signs: Vitals:   01/21/24 0251  BP: (!) 133/90  Pulse: (!) 111  Resp: (!) 26  Temp: 99.3 F (37.4 C)  SpO2: 91%   General: Awake, alert, oriented. CV:  Good peripheral perfusion. Tachycardia. Resp:  Slightly increased work of breathing, tachypnea, diffuse expiratory wheezing. Abd:  No distention.    ED Results / Procedures / Treatments   Labs (all labs ordered are listed, but only abnormal results are displayed) Labs Reviewed  COMPREHENSIVE METABOLIC PANEL WITH GFR - Abnormal; Notable for the following components:      Result Value   Glucose, Bld 134 (*)    Creatinine, Ser 1.43 (*)    Alkaline Phosphatase 129 (*)    GFR, Estimated 60 (*)     All other components within normal limits  RESP PANEL BY RT-PCR (RSV, FLU A&B, COVID)  RVPGX2  CBC     EKG  None   RADIOLOGY I independently interpreted and visualized the CXR. My interpretation: No pneumonia Radiology interpretation:  IMPRESSION:  1. No acute cardiopulmonary process.      PROCEDURES:  Critical Care performed: No    MEDICATIONS ORDERED IN ED: Medications - No data to display   IMPRESSION / MDM / ASSESSMENT AND PLAN / ED COURSE  I reviewed the triage vital signs and the nursing notes.                              Differential diagnosis includes, but is not limited to, asthma exacerbation, viral URI, pneumonia  Patient's presentation is most consistent with acute presentation with potential threat to life or bodily function.   Patient presented to the emergency department today because concerns for shortness of breath.  Patient with history of asthma.  On initial exam patient does have increased work of breathing with tachypnea and diffuse expiratory wheezing.  Patient was ordered steroids and DuoNeb treatments.  Workup including chest x-ray was obtained.  Chest x-ray without any signs of pneumonia or pneumothorax.  Viral panel was negative.  At this time I do think likely asthma exacerbation.  After breathing treatments and steroids patient was feeling better.  Did give another round of breathing treatments.  After second round patient did feel comfortable managing his asthma at home.  Will give patient prescription for further steroids.  FINAL CLINICAL IMPRESSION(S) / ED DIAGNOSES   Final diagnoses:  Exacerbation of asthma, unspecified asthma severity, unspecified whether persistent        Rx / DC Orders   ED Discharge Orders     None        Note:  This document was prepared using Dragon voice recognition software and may include unintentional dictation errors.    Floy Roberts, MD 01/21/24 [phone]  "
